# Patient Record
Sex: Female | Born: 1950 | Race: Black or African American | Hispanic: No | Smoking: Current every day smoker
Health system: Southern US, Community
[De-identification: ages and names within clinical notes are randomized; demographics above are authoritative.]

## PROBLEM LIST (undated history)

## (undated) DIAGNOSIS — E7800 Pure hypercholesterolemia, unspecified: Secondary | ICD-10-CM

## (undated) DIAGNOSIS — G629 Polyneuropathy, unspecified: Secondary | ICD-10-CM

## (undated) DIAGNOSIS — M1990 Unspecified osteoarthritis, unspecified site: Secondary | ICD-10-CM

## (undated) DIAGNOSIS — I10 Essential (primary) hypertension: Secondary | ICD-10-CM

## (undated) DIAGNOSIS — I509 Heart failure, unspecified: Secondary | ICD-10-CM

## (undated) DIAGNOSIS — M549 Dorsalgia, unspecified: Secondary | ICD-10-CM

## (undated) HISTORY — PX: KNEE SURGERY: SHX244

## (undated) HISTORY — PX: BACK SURGERY: SHX140

## (undated) HISTORY — PX: TONSILLECTOMY: SUR1361

## (undated) HISTORY — PX: ABDOMINAL HYSTERECTOMY: SHX81

---

## 1998-11-02 ENCOUNTER — Emergency Department (HOSPITAL_COMMUNITY): Admission: EM | Admit: 1998-11-02 | Discharge: 1998-11-02 | Payer: Self-pay | Admitting: Emergency Medicine

## 1999-02-12 ENCOUNTER — Emergency Department (HOSPITAL_COMMUNITY): Admission: EM | Admit: 1999-02-12 | Discharge: 1999-02-13 | Payer: Self-pay | Admitting: Emergency Medicine

## 1999-04-22 ENCOUNTER — Emergency Department (HOSPITAL_COMMUNITY): Admission: EM | Admit: 1999-04-22 | Discharge: 1999-04-22 | Payer: Self-pay | Admitting: Emergency Medicine

## 1999-04-30 ENCOUNTER — Emergency Department (HOSPITAL_COMMUNITY): Admission: EM | Admit: 1999-04-30 | Discharge: 1999-04-30 | Payer: Self-pay | Admitting: Internal Medicine

## 1999-05-03 ENCOUNTER — Emergency Department (HOSPITAL_COMMUNITY): Admission: EM | Admit: 1999-05-03 | Discharge: 1999-05-03 | Payer: Self-pay | Admitting: Emergency Medicine

## 1999-05-05 ENCOUNTER — Emergency Department (HOSPITAL_COMMUNITY): Admission: EM | Admit: 1999-05-05 | Discharge: 1999-05-06 | Payer: Self-pay | Admitting: Emergency Medicine

## 1999-05-10 ENCOUNTER — Emergency Department (HOSPITAL_COMMUNITY): Admission: EM | Admit: 1999-05-10 | Discharge: 1999-05-10 | Payer: Self-pay | Admitting: Emergency Medicine

## 1999-06-09 ENCOUNTER — Ambulatory Visit (HOSPITAL_COMMUNITY): Admission: RE | Admit: 1999-06-09 | Discharge: 1999-06-09 | Payer: Self-pay | Admitting: Cardiology

## 1999-06-16 ENCOUNTER — Emergency Department (HOSPITAL_COMMUNITY): Admission: EM | Admit: 1999-06-16 | Discharge: 1999-06-16 | Payer: Self-pay | Admitting: Emergency Medicine

## 1999-08-02 ENCOUNTER — Emergency Department (HOSPITAL_COMMUNITY): Admission: EM | Admit: 1999-08-02 | Discharge: 1999-08-02 | Payer: Self-pay | Admitting: Emergency Medicine

## 1999-10-07 ENCOUNTER — Inpatient Hospital Stay (HOSPITAL_COMMUNITY): Admission: EM | Admit: 1999-10-07 | Discharge: 1999-10-09 | Payer: Self-pay | Admitting: Cardiology

## 1999-10-27 ENCOUNTER — Emergency Department (HOSPITAL_COMMUNITY): Admission: EM | Admit: 1999-10-27 | Discharge: 1999-10-27 | Payer: Self-pay | Admitting: Emergency Medicine

## 1999-12-13 ENCOUNTER — Inpatient Hospital Stay (HOSPITAL_COMMUNITY): Admission: EM | Admit: 1999-12-13 | Discharge: 1999-12-18 | Payer: Self-pay | Admitting: Emergency Medicine

## 2000-01-06 ENCOUNTER — Emergency Department (HOSPITAL_COMMUNITY): Admission: EM | Admit: 2000-01-06 | Discharge: 2000-01-06 | Payer: Self-pay | Admitting: Emergency Medicine

## 2000-02-16 ENCOUNTER — Emergency Department (HOSPITAL_COMMUNITY): Admission: EM | Admit: 2000-02-16 | Discharge: 2000-02-16 | Payer: Self-pay | Admitting: Emergency Medicine

## 2000-03-18 ENCOUNTER — Emergency Department (HOSPITAL_COMMUNITY): Admission: EM | Admit: 2000-03-18 | Discharge: 2000-03-18 | Payer: Self-pay | Admitting: Emergency Medicine

## 2000-05-10 ENCOUNTER — Inpatient Hospital Stay (HOSPITAL_COMMUNITY): Admission: EM | Admit: 2000-05-10 | Discharge: 2000-05-12 | Payer: Self-pay | Admitting: Emergency Medicine

## 2000-05-29 ENCOUNTER — Emergency Department (HOSPITAL_COMMUNITY): Admission: EM | Admit: 2000-05-29 | Discharge: 2000-05-29 | Payer: Self-pay | Admitting: Emergency Medicine

## 2000-06-27 ENCOUNTER — Emergency Department (HOSPITAL_COMMUNITY): Admission: EM | Admit: 2000-06-27 | Discharge: 2000-06-27 | Payer: Self-pay | Admitting: *Deleted

## 2000-07-07 ENCOUNTER — Inpatient Hospital Stay (HOSPITAL_COMMUNITY): Admission: EM | Admit: 2000-07-07 | Discharge: 2000-07-09 | Payer: Self-pay | Admitting: Emergency Medicine

## 2000-07-14 ENCOUNTER — Inpatient Hospital Stay (HOSPITAL_COMMUNITY): Admission: EM | Admit: 2000-07-14 | Discharge: 2000-07-19 | Payer: Self-pay | Admitting: Emergency Medicine

## 2000-07-25 ENCOUNTER — Emergency Department (HOSPITAL_COMMUNITY): Admission: EM | Admit: 2000-07-25 | Discharge: 2000-07-25 | Payer: Self-pay | Admitting: Emergency Medicine

## 2000-08-18 ENCOUNTER — Ambulatory Visit (HOSPITAL_COMMUNITY): Admission: RE | Admit: 2000-08-18 | Discharge: 2000-08-18 | Payer: Self-pay | Admitting: Family Medicine

## 2000-08-21 ENCOUNTER — Emergency Department (HOSPITAL_COMMUNITY): Admission: EM | Admit: 2000-08-21 | Discharge: 2000-08-21 | Payer: Self-pay | Admitting: Emergency Medicine

## 2000-09-28 ENCOUNTER — Emergency Department (HOSPITAL_COMMUNITY): Admission: EM | Admit: 2000-09-28 | Discharge: 2000-09-28 | Payer: Self-pay | Admitting: Emergency Medicine

## 2000-10-03 ENCOUNTER — Emergency Department (HOSPITAL_COMMUNITY): Admission: EM | Admit: 2000-10-03 | Discharge: 2000-10-03 | Payer: Self-pay | Admitting: Emergency Medicine

## 2000-11-10 ENCOUNTER — Inpatient Hospital Stay (HOSPITAL_COMMUNITY): Admission: EM | Admit: 2000-11-10 | Discharge: 2000-11-11 | Payer: Self-pay | Admitting: Emergency Medicine

## 2000-11-25 ENCOUNTER — Emergency Department (HOSPITAL_COMMUNITY): Admission: EM | Admit: 2000-11-25 | Discharge: 2000-11-25 | Payer: Self-pay | Admitting: Emergency Medicine

## 2000-12-01 ENCOUNTER — Encounter: Admission: RE | Admit: 2000-12-01 | Discharge: 2000-12-01 | Payer: Self-pay | Admitting: Family Medicine

## 2001-03-01 ENCOUNTER — Emergency Department (HOSPITAL_COMMUNITY): Admission: EM | Admit: 2001-03-01 | Discharge: 2001-03-02 | Payer: Self-pay | Admitting: Emergency Medicine

## 2001-04-03 ENCOUNTER — Emergency Department (HOSPITAL_COMMUNITY): Admission: EM | Admit: 2001-04-03 | Discharge: 2001-04-04 | Payer: Self-pay | Admitting: Emergency Medicine

## 2001-04-14 ENCOUNTER — Emergency Department (HOSPITAL_COMMUNITY): Admission: EM | Admit: 2001-04-14 | Discharge: 2001-04-15 | Payer: Self-pay | Admitting: Emergency Medicine

## 2001-04-22 ENCOUNTER — Emergency Department (HOSPITAL_COMMUNITY): Admission: EM | Admit: 2001-04-22 | Discharge: 2001-04-22 | Payer: Self-pay | Admitting: Emergency Medicine

## 2001-04-25 ENCOUNTER — Emergency Department (HOSPITAL_COMMUNITY): Admission: EM | Admit: 2001-04-25 | Discharge: 2001-04-26 | Payer: Self-pay | Admitting: Emergency Medicine

## 2001-04-26 ENCOUNTER — Emergency Department (HOSPITAL_COMMUNITY): Admission: EM | Admit: 2001-04-26 | Discharge: 2001-04-26 | Payer: Self-pay | Admitting: Emergency Medicine

## 2001-04-30 ENCOUNTER — Emergency Department (HOSPITAL_COMMUNITY): Admission: EM | Admit: 2001-04-30 | Discharge: 2001-04-30 | Payer: Self-pay

## 2001-05-10 ENCOUNTER — Emergency Department (HOSPITAL_COMMUNITY): Admission: EM | Admit: 2001-05-10 | Discharge: 2001-05-10 | Payer: Self-pay | Admitting: Emergency Medicine

## 2001-05-13 ENCOUNTER — Encounter: Payer: Self-pay | Admitting: Emergency Medicine

## 2001-05-13 ENCOUNTER — Emergency Department (HOSPITAL_COMMUNITY): Admission: EM | Admit: 2001-05-13 | Discharge: 2001-05-13 | Payer: Self-pay | Admitting: Emergency Medicine

## 2001-05-25 ENCOUNTER — Encounter: Admission: RE | Admit: 2001-05-25 | Discharge: 2001-08-07 | Payer: Self-pay | Admitting: Orthopedic Surgery

## 2001-05-30 ENCOUNTER — Emergency Department (HOSPITAL_COMMUNITY): Admission: EM | Admit: 2001-05-30 | Discharge: 2001-05-30 | Payer: Self-pay | Admitting: Emergency Medicine

## 2001-06-19 ENCOUNTER — Emergency Department (HOSPITAL_COMMUNITY): Admission: EM | Admit: 2001-06-19 | Discharge: 2001-06-19 | Payer: Self-pay | Admitting: Emergency Medicine

## 2001-06-26 ENCOUNTER — Encounter: Payer: Self-pay | Admitting: Family Medicine

## 2001-06-26 ENCOUNTER — Ambulatory Visit (HOSPITAL_COMMUNITY): Admission: RE | Admit: 2001-06-26 | Discharge: 2001-06-26 | Payer: Self-pay | Admitting: Family Medicine

## 2001-07-13 ENCOUNTER — Encounter: Payer: Self-pay | Admitting: Family Medicine

## 2001-07-13 ENCOUNTER — Ambulatory Visit (HOSPITAL_COMMUNITY): Admission: RE | Admit: 2001-07-13 | Discharge: 2001-07-13 | Payer: Self-pay | Admitting: Family Medicine

## 2001-08-05 ENCOUNTER — Encounter: Payer: Self-pay | Admitting: Emergency Medicine

## 2001-08-06 ENCOUNTER — Inpatient Hospital Stay (HOSPITAL_COMMUNITY): Admission: EM | Admit: 2001-08-06 | Discharge: 2001-08-08 | Payer: Self-pay | Admitting: Emergency Medicine

## 2001-08-07 ENCOUNTER — Encounter: Payer: Self-pay | Admitting: Cardiovascular Disease

## 2001-08-22 ENCOUNTER — Ambulatory Visit (HOSPITAL_COMMUNITY): Admission: RE | Admit: 2001-08-22 | Discharge: 2001-08-22 | Payer: Self-pay | Admitting: Family Medicine

## 2001-10-09 ENCOUNTER — Emergency Department (HOSPITAL_COMMUNITY): Admission: EM | Admit: 2001-10-09 | Discharge: 2001-10-09 | Payer: Self-pay

## 2001-11-05 ENCOUNTER — Encounter: Payer: Self-pay | Admitting: Emergency Medicine

## 2001-11-05 ENCOUNTER — Emergency Department (HOSPITAL_COMMUNITY): Admission: EM | Admit: 2001-11-05 | Discharge: 2001-11-05 | Payer: Self-pay | Admitting: Emergency Medicine

## 2001-12-12 ENCOUNTER — Other Ambulatory Visit: Admission: RE | Admit: 2001-12-12 | Discharge: 2001-12-12 | Payer: Self-pay | Admitting: Family Medicine

## 2001-12-25 ENCOUNTER — Emergency Department (HOSPITAL_COMMUNITY): Admission: EM | Admit: 2001-12-25 | Discharge: 2001-12-25 | Payer: Self-pay

## 2001-12-31 ENCOUNTER — Encounter: Payer: Self-pay | Admitting: Emergency Medicine

## 2001-12-31 ENCOUNTER — Emergency Department (HOSPITAL_COMMUNITY): Admission: EM | Admit: 2001-12-31 | Discharge: 2001-12-31 | Payer: Self-pay | Admitting: Emergency Medicine

## 2002-03-12 ENCOUNTER — Emergency Department (HOSPITAL_COMMUNITY): Admission: EM | Admit: 2002-03-12 | Discharge: 2002-03-12 | Payer: Self-pay

## 2002-04-09 ENCOUNTER — Encounter: Payer: Self-pay | Admitting: Emergency Medicine

## 2002-04-09 ENCOUNTER — Emergency Department (HOSPITAL_COMMUNITY): Admission: EM | Admit: 2002-04-09 | Discharge: 2002-04-10 | Payer: Self-pay | Admitting: Emergency Medicine

## 2002-05-14 ENCOUNTER — Encounter: Payer: Self-pay | Admitting: Cardiovascular Disease

## 2002-05-14 ENCOUNTER — Ambulatory Visit (HOSPITAL_COMMUNITY): Admission: RE | Admit: 2002-05-14 | Discharge: 2002-05-14 | Payer: Self-pay | Admitting: Cardiovascular Disease

## 2002-05-21 ENCOUNTER — Emergency Department (HOSPITAL_COMMUNITY): Admission: EM | Admit: 2002-05-21 | Discharge: 2002-05-21 | Payer: Self-pay | Admitting: Emergency Medicine

## 2002-06-05 ENCOUNTER — Ambulatory Visit (HOSPITAL_COMMUNITY): Admission: RE | Admit: 2002-06-05 | Discharge: 2002-06-05 | Payer: Self-pay | Admitting: Family Medicine

## 2002-06-05 ENCOUNTER — Encounter: Payer: Self-pay | Admitting: Family Medicine

## 2002-06-09 ENCOUNTER — Emergency Department (HOSPITAL_COMMUNITY): Admission: EM | Admit: 2002-06-09 | Discharge: 2002-06-10 | Payer: Self-pay | Admitting: Emergency Medicine

## 2002-07-04 ENCOUNTER — Encounter: Admission: RE | Admit: 2002-07-04 | Discharge: 2002-07-04 | Payer: Self-pay | Admitting: Orthopedic Surgery

## 2002-07-04 ENCOUNTER — Encounter: Payer: Self-pay | Admitting: Orthopedic Surgery

## 2002-07-10 ENCOUNTER — Emergency Department (HOSPITAL_COMMUNITY): Admission: EM | Admit: 2002-07-10 | Discharge: 2002-07-10 | Payer: Self-pay | Admitting: *Deleted

## 2002-07-17 ENCOUNTER — Inpatient Hospital Stay (HOSPITAL_COMMUNITY): Admission: EM | Admit: 2002-07-17 | Discharge: 2002-07-18 | Payer: Self-pay | Admitting: Emergency Medicine

## 2002-07-18 ENCOUNTER — Encounter: Payer: Self-pay | Admitting: Cardiovascular Disease

## 2002-07-25 ENCOUNTER — Encounter: Admission: RE | Admit: 2002-07-25 | Discharge: 2002-07-25 | Payer: Self-pay | Admitting: Cardiovascular Disease

## 2002-08-03 ENCOUNTER — Encounter: Payer: Self-pay | Admitting: Neurosurgery

## 2002-08-03 ENCOUNTER — Inpatient Hospital Stay (HOSPITAL_COMMUNITY): Admission: RE | Admit: 2002-08-03 | Discharge: 2002-08-05 | Payer: Self-pay | Admitting: Neurosurgery

## 2002-08-16 ENCOUNTER — Emergency Department (HOSPITAL_COMMUNITY): Admission: EM | Admit: 2002-08-16 | Discharge: 2002-08-16 | Payer: Self-pay

## 2002-08-26 ENCOUNTER — Emergency Department (HOSPITAL_COMMUNITY): Admission: EM | Admit: 2002-08-26 | Discharge: 2002-08-26 | Payer: Self-pay | Admitting: Emergency Medicine

## 2002-09-13 ENCOUNTER — Encounter: Admission: RE | Admit: 2002-09-13 | Discharge: 2002-09-13 | Payer: Self-pay | Admitting: Neurosurgery

## 2002-09-13 ENCOUNTER — Encounter: Payer: Self-pay | Admitting: Neurosurgery

## 2002-09-26 ENCOUNTER — Encounter: Payer: Self-pay | Admitting: Neurosurgery

## 2002-09-26 ENCOUNTER — Encounter: Admission: RE | Admit: 2002-09-26 | Discharge: 2002-09-26 | Payer: Self-pay | Admitting: Neurosurgery

## 2002-10-10 ENCOUNTER — Encounter: Admission: RE | Admit: 2002-10-10 | Discharge: 2002-10-10 | Payer: Self-pay | Admitting: Neurosurgery

## 2002-10-10 ENCOUNTER — Encounter: Payer: Self-pay | Admitting: Neurosurgery

## 2002-10-21 ENCOUNTER — Emergency Department (HOSPITAL_COMMUNITY): Admission: EM | Admit: 2002-10-21 | Discharge: 2002-10-21 | Payer: Self-pay | Admitting: Emergency Medicine

## 2002-10-22 ENCOUNTER — Ambulatory Visit (HOSPITAL_COMMUNITY): Admission: RE | Admit: 2002-10-22 | Discharge: 2002-10-22 | Payer: Self-pay | Admitting: Neurosurgery

## 2002-10-22 ENCOUNTER — Encounter: Payer: Self-pay | Admitting: Neurosurgery

## 2002-10-24 ENCOUNTER — Ambulatory Visit (HOSPITAL_COMMUNITY): Admission: RE | Admit: 2002-10-24 | Discharge: 2002-10-24 | Payer: Self-pay | Admitting: Neurosurgery

## 2002-11-04 ENCOUNTER — Emergency Department (HOSPITAL_COMMUNITY): Admission: EM | Admit: 2002-11-04 | Discharge: 2002-11-04 | Payer: Self-pay | Admitting: *Deleted

## 2002-11-07 ENCOUNTER — Encounter: Payer: Self-pay | Admitting: Neurosurgery

## 2002-11-09 ENCOUNTER — Inpatient Hospital Stay (HOSPITAL_COMMUNITY): Admission: RE | Admit: 2002-11-09 | Discharge: 2002-11-13 | Payer: Self-pay | Admitting: Neurosurgery

## 2002-11-09 ENCOUNTER — Encounter: Payer: Self-pay | Admitting: Neurosurgery

## 2002-11-23 ENCOUNTER — Emergency Department (HOSPITAL_COMMUNITY): Admission: EM | Admit: 2002-11-23 | Discharge: 2002-11-23 | Payer: Self-pay | Admitting: Emergency Medicine

## 2002-11-23 ENCOUNTER — Encounter: Payer: Self-pay | Admitting: Emergency Medicine

## 2003-01-10 ENCOUNTER — Encounter: Payer: Self-pay | Admitting: Emergency Medicine

## 2003-01-10 ENCOUNTER — Emergency Department (HOSPITAL_COMMUNITY): Admission: EM | Admit: 2003-01-10 | Discharge: 2003-01-10 | Payer: Self-pay | Admitting: Emergency Medicine

## 2003-02-01 ENCOUNTER — Emergency Department (HOSPITAL_COMMUNITY): Admission: EM | Admit: 2003-02-01 | Discharge: 2003-02-01 | Payer: Self-pay | Admitting: Emergency Medicine

## 2003-02-06 ENCOUNTER — Encounter: Admission: RE | Admit: 2003-02-06 | Discharge: 2003-02-06 | Payer: Self-pay | Admitting: Neurosurgery

## 2003-02-06 ENCOUNTER — Encounter: Payer: Self-pay | Admitting: Neurosurgery

## 2003-02-09 ENCOUNTER — Emergency Department (HOSPITAL_COMMUNITY): Admission: EM | Admit: 2003-02-09 | Discharge: 2003-02-09 | Payer: Self-pay | Admitting: Emergency Medicine

## 2003-02-20 ENCOUNTER — Inpatient Hospital Stay (HOSPITAL_COMMUNITY): Admission: RE | Admit: 2003-02-20 | Discharge: 2003-02-25 | Payer: Self-pay | Admitting: Neurosurgery

## 2003-02-20 ENCOUNTER — Encounter: Payer: Self-pay | Admitting: Neurosurgery

## 2003-03-03 ENCOUNTER — Emergency Department (HOSPITAL_COMMUNITY): Admission: EM | Admit: 2003-03-03 | Discharge: 2003-03-03 | Payer: Self-pay | Admitting: Emergency Medicine

## 2003-03-03 ENCOUNTER — Encounter: Payer: Self-pay | Admitting: Emergency Medicine

## 2003-04-05 ENCOUNTER — Encounter: Payer: Self-pay | Admitting: *Deleted

## 2003-04-05 ENCOUNTER — Emergency Department (HOSPITAL_COMMUNITY): Admission: EM | Admit: 2003-04-05 | Discharge: 2003-04-05 | Payer: Self-pay | Admitting: *Deleted

## 2003-04-25 ENCOUNTER — Emergency Department (HOSPITAL_COMMUNITY): Admission: EM | Admit: 2003-04-25 | Discharge: 2003-04-25 | Payer: Self-pay | Admitting: Emergency Medicine

## 2003-05-28 ENCOUNTER — Encounter: Payer: Self-pay | Admitting: Emergency Medicine

## 2003-05-28 ENCOUNTER — Emergency Department (HOSPITAL_COMMUNITY): Admission: EM | Admit: 2003-05-28 | Discharge: 2003-05-28 | Payer: Self-pay | Admitting: Emergency Medicine

## 2003-06-12 ENCOUNTER — Encounter: Payer: Self-pay | Admitting: Family Medicine

## 2003-06-12 ENCOUNTER — Ambulatory Visit (HOSPITAL_COMMUNITY): Admission: RE | Admit: 2003-06-12 | Discharge: 2003-06-12 | Payer: Self-pay | Admitting: Family Medicine

## 2003-08-27 ENCOUNTER — Emergency Department (HOSPITAL_COMMUNITY): Admission: EM | Admit: 2003-08-27 | Discharge: 2003-08-28 | Payer: Self-pay | Admitting: Emergency Medicine

## 2003-10-09 ENCOUNTER — Emergency Department (HOSPITAL_COMMUNITY): Admission: EM | Admit: 2003-10-09 | Discharge: 2003-10-10 | Payer: Self-pay | Admitting: Emergency Medicine

## 2004-01-28 ENCOUNTER — Emergency Department (HOSPITAL_COMMUNITY): Admission: EM | Admit: 2004-01-28 | Discharge: 2004-01-28 | Payer: Self-pay | Admitting: Family Medicine

## 2004-03-15 ENCOUNTER — Emergency Department (HOSPITAL_COMMUNITY): Admission: EM | Admit: 2004-03-15 | Discharge: 2004-03-15 | Payer: Self-pay | Admitting: *Deleted

## 2004-04-18 ENCOUNTER — Emergency Department (HOSPITAL_COMMUNITY): Admission: EM | Admit: 2004-04-18 | Discharge: 2004-04-18 | Payer: Self-pay | Admitting: Family Medicine

## 2004-04-18 ENCOUNTER — Emergency Department (HOSPITAL_COMMUNITY): Admission: EM | Admit: 2004-04-18 | Discharge: 2004-04-18 | Payer: Self-pay | Admitting: Emergency Medicine

## 2004-05-09 ENCOUNTER — Emergency Department (HOSPITAL_COMMUNITY): Admission: EM | Admit: 2004-05-09 | Discharge: 2004-05-09 | Payer: Self-pay | Admitting: Family Medicine

## 2004-05-19 ENCOUNTER — Emergency Department (HOSPITAL_COMMUNITY): Admission: EM | Admit: 2004-05-19 | Discharge: 2004-05-19 | Payer: Self-pay | Admitting: Emergency Medicine

## 2004-06-22 ENCOUNTER — Ambulatory Visit: Payer: Self-pay | Admitting: Family Medicine

## 2004-06-25 ENCOUNTER — Ambulatory Visit (HOSPITAL_COMMUNITY): Admission: RE | Admit: 2004-06-25 | Discharge: 2004-06-25 | Payer: Self-pay | Admitting: Family Medicine

## 2004-07-06 ENCOUNTER — Emergency Department (HOSPITAL_COMMUNITY): Admission: EM | Admit: 2004-07-06 | Discharge: 2004-07-06 | Payer: Self-pay | Admitting: Family Medicine

## 2004-07-23 ENCOUNTER — Emergency Department (HOSPITAL_COMMUNITY): Admission: EM | Admit: 2004-07-23 | Discharge: 2004-07-23 | Payer: Self-pay | Admitting: Emergency Medicine

## 2004-08-23 ENCOUNTER — Emergency Department (HOSPITAL_COMMUNITY): Admission: EM | Admit: 2004-08-23 | Discharge: 2004-08-23 | Payer: Self-pay | Admitting: Emergency Medicine

## 2004-08-26 ENCOUNTER — Ambulatory Visit (HOSPITAL_COMMUNITY): Admission: RE | Admit: 2004-08-26 | Discharge: 2004-08-26 | Payer: Self-pay | Admitting: Cardiovascular Disease

## 2004-09-22 ENCOUNTER — Ambulatory Visit: Payer: Self-pay | Admitting: Family Medicine

## 2004-10-12 ENCOUNTER — Emergency Department (HOSPITAL_COMMUNITY): Admission: AD | Admit: 2004-10-12 | Discharge: 2004-10-12 | Payer: Self-pay | Admitting: Family Medicine

## 2005-01-06 ENCOUNTER — Emergency Department (HOSPITAL_COMMUNITY): Admission: EM | Admit: 2005-01-06 | Discharge: 2005-01-06 | Payer: Self-pay | Admitting: Family Medicine

## 2005-01-23 ENCOUNTER — Emergency Department (HOSPITAL_COMMUNITY): Admission: EM | Admit: 2005-01-23 | Discharge: 2005-01-23 | Payer: Self-pay | Admitting: Family Medicine

## 2005-01-29 ENCOUNTER — Ambulatory Visit: Payer: Self-pay | Admitting: Internal Medicine

## 2005-02-18 ENCOUNTER — Ambulatory Visit: Payer: Self-pay | Admitting: Family Medicine

## 2005-02-19 ENCOUNTER — Emergency Department (HOSPITAL_COMMUNITY): Admission: EM | Admit: 2005-02-19 | Discharge: 2005-02-19 | Payer: Self-pay | Admitting: Family Medicine

## 2005-02-22 ENCOUNTER — Ambulatory Visit: Payer: Self-pay | Admitting: Family Medicine

## 2005-02-23 ENCOUNTER — Ambulatory Visit (HOSPITAL_COMMUNITY): Admission: RE | Admit: 2005-02-23 | Discharge: 2005-02-23 | Payer: Self-pay | Admitting: Family Medicine

## 2005-03-01 ENCOUNTER — Ambulatory Visit: Payer: Self-pay | Admitting: Family Medicine

## 2005-03-19 ENCOUNTER — Emergency Department (HOSPITAL_COMMUNITY): Admission: EM | Admit: 2005-03-19 | Discharge: 2005-03-19 | Payer: Self-pay | Admitting: Emergency Medicine

## 2005-03-28 ENCOUNTER — Emergency Department (HOSPITAL_COMMUNITY): Admission: EM | Admit: 2005-03-28 | Discharge: 2005-03-28 | Payer: Self-pay | Admitting: Emergency Medicine

## 2005-04-06 ENCOUNTER — Ambulatory Visit: Payer: Self-pay | Admitting: Family Medicine

## 2005-04-24 ENCOUNTER — Emergency Department (HOSPITAL_COMMUNITY): Admission: EM | Admit: 2005-04-24 | Discharge: 2005-04-24 | Payer: Self-pay | Admitting: Family Medicine

## 2005-05-04 ENCOUNTER — Ambulatory Visit (HOSPITAL_COMMUNITY): Admission: RE | Admit: 2005-05-04 | Discharge: 2005-05-04 | Payer: Self-pay | Admitting: Neurosurgery

## 2005-05-25 ENCOUNTER — Encounter: Admission: RE | Admit: 2005-05-25 | Discharge: 2005-05-25 | Payer: Self-pay | Admitting: Neurosurgery

## 2005-06-09 ENCOUNTER — Encounter: Admission: RE | Admit: 2005-06-09 | Discharge: 2005-06-09 | Payer: Self-pay | Admitting: Neurosurgery

## 2005-06-21 ENCOUNTER — Ambulatory Visit: Payer: Self-pay | Admitting: Family Medicine

## 2005-07-14 ENCOUNTER — Ambulatory Visit: Payer: Self-pay | Admitting: Family Medicine

## 2005-09-20 ENCOUNTER — Ambulatory Visit: Payer: Self-pay | Admitting: Family Medicine

## 2005-10-06 ENCOUNTER — Ambulatory Visit: Payer: Self-pay | Admitting: Family Medicine

## 2005-11-16 ENCOUNTER — Ambulatory Visit: Payer: Self-pay | Admitting: Family Medicine

## 2006-02-02 ENCOUNTER — Emergency Department (HOSPITAL_COMMUNITY): Admission: EM | Admit: 2006-02-02 | Discharge: 2006-02-02 | Payer: Self-pay | Admitting: Family Medicine

## 2006-02-04 ENCOUNTER — Emergency Department (HOSPITAL_COMMUNITY): Admission: EM | Admit: 2006-02-04 | Discharge: 2006-02-04 | Payer: Self-pay | Admitting: Family Medicine

## 2006-02-06 ENCOUNTER — Emergency Department (HOSPITAL_COMMUNITY): Admission: EM | Admit: 2006-02-06 | Discharge: 2006-02-06 | Payer: Self-pay | Admitting: Emergency Medicine

## 2006-02-09 ENCOUNTER — Encounter: Payer: Self-pay | Admitting: Cardiovascular Disease

## 2006-02-09 ENCOUNTER — Encounter: Payer: Self-pay | Admitting: Cardiology

## 2006-02-09 ENCOUNTER — Encounter: Payer: Self-pay | Admitting: Family Medicine

## 2006-02-09 ENCOUNTER — Encounter: Payer: Self-pay | Admitting: Emergency Medicine

## 2006-02-25 ENCOUNTER — Emergency Department (HOSPITAL_COMMUNITY): Admission: EM | Admit: 2006-02-25 | Discharge: 2006-02-25 | Payer: Self-pay | Admitting: Emergency Medicine

## 2006-04-15 ENCOUNTER — Ambulatory Visit: Payer: Self-pay | Admitting: Family Medicine

## 2006-05-04 ENCOUNTER — Ambulatory Visit: Payer: Self-pay | Admitting: Family Medicine

## 2006-07-08 ENCOUNTER — Ambulatory Visit: Payer: Self-pay | Admitting: Family Medicine

## 2007-02-02 ENCOUNTER — Ambulatory Visit (HOSPITAL_COMMUNITY): Admission: RE | Admit: 2007-02-02 | Discharge: 2007-02-02 | Payer: Self-pay | Admitting: *Deleted

## 2007-05-02 DIAGNOSIS — M479 Spondylosis, unspecified: Secondary | ICD-10-CM | POA: Insufficient documentation

## 2007-05-02 DIAGNOSIS — F1910 Other psychoactive substance abuse, uncomplicated: Secondary | ICD-10-CM | POA: Insufficient documentation

## 2007-05-02 DIAGNOSIS — J309 Allergic rhinitis, unspecified: Secondary | ICD-10-CM | POA: Insufficient documentation

## 2007-05-02 DIAGNOSIS — E119 Type 2 diabetes mellitus without complications: Secondary | ICD-10-CM | POA: Insufficient documentation

## 2007-05-02 DIAGNOSIS — F329 Major depressive disorder, single episode, unspecified: Secondary | ICD-10-CM

## 2007-05-02 DIAGNOSIS — Z8679 Personal history of other diseases of the circulatory system: Secondary | ICD-10-CM | POA: Insufficient documentation

## 2007-05-15 DIAGNOSIS — I10 Essential (primary) hypertension: Secondary | ICD-10-CM | POA: Insufficient documentation

## 2007-12-18 ENCOUNTER — Encounter: Admission: RE | Admit: 2007-12-18 | Discharge: 2007-12-18 | Payer: Self-pay | Admitting: Family Medicine

## 2008-02-23 ENCOUNTER — Emergency Department (HOSPITAL_COMMUNITY): Admission: EM | Admit: 2008-02-23 | Discharge: 2008-02-23 | Payer: Self-pay | Admitting: Family Medicine

## 2008-08-04 ENCOUNTER — Observation Stay (HOSPITAL_COMMUNITY): Admission: EM | Admit: 2008-08-04 | Discharge: 2008-08-06 | Payer: Self-pay | Admitting: Emergency Medicine

## 2008-08-05 ENCOUNTER — Ambulatory Visit: Payer: Self-pay | Admitting: Vascular Surgery

## 2008-08-05 ENCOUNTER — Encounter (INDEPENDENT_AMBULATORY_CARE_PROVIDER_SITE_OTHER): Payer: Self-pay | Admitting: Internal Medicine

## 2009-02-23 ENCOUNTER — Emergency Department (HOSPITAL_COMMUNITY): Admission: EM | Admit: 2009-02-23 | Discharge: 2009-02-23 | Payer: Self-pay | Admitting: Emergency Medicine

## 2009-03-03 ENCOUNTER — Encounter: Admission: RE | Admit: 2009-03-03 | Discharge: 2009-03-03 | Payer: Self-pay | Admitting: Family Medicine

## 2009-03-17 ENCOUNTER — Ambulatory Visit (HOSPITAL_COMMUNITY): Admission: RE | Admit: 2009-03-17 | Discharge: 2009-03-17 | Payer: Self-pay | Admitting: Cardiovascular Disease

## 2009-12-19 ENCOUNTER — Emergency Department (HOSPITAL_COMMUNITY): Admission: EM | Admit: 2009-12-19 | Discharge: 2009-12-19 | Payer: Self-pay | Admitting: Family Medicine

## 2010-12-31 ENCOUNTER — Inpatient Hospital Stay (INDEPENDENT_AMBULATORY_CARE_PROVIDER_SITE_OTHER)
Admission: RE | Admit: 2010-12-31 | Discharge: 2010-12-31 | Disposition: A | Discharge status: Home / Self Care | Payer: Medicare Other | Source: Ambulatory Visit | Attending: Family Medicine | Admitting: Family Medicine

## 2010-12-31 DIAGNOSIS — J069 Acute upper respiratory infection, unspecified: Secondary | ICD-10-CM

## 2010-12-31 DIAGNOSIS — M549 Dorsalgia, unspecified: Secondary | ICD-10-CM

## 2011-02-23 NOTE — H&P (Signed)
[name], [name]             ACCOUNT NO.:  [id number]   MEDICAL RECORD NO.:  [id number]          PATIENT TYPE:  INP   LOCATION:  2039                         FACILITY:  [facility]   PHYSICIAN:  Kelsey Carey, M.D. DATE OF BIRTH:  December 12, 1950   DATE OF ADMISSION:  08/04/2008  DATE OF DISCHARGE:                              HISTORY & PHYSICAL   PRIMARY CARE PHYSICIAN:  Kelsey Carey, M.D.   CHIEF COMPLAINT:  Chest pain.   HISTORY OF PRESENT ILLNESS:  This is a 60 year old female who was  brought to the hospital secondary to complaints of chest pain, which  started at about 1:30 p.m. while she was on her job.  She describes  having substernal area chest pain,  which lasted for a few minutes and  was associated with nausea, no vomiting, but diaphoresis.  She states  the pain recurred.  She was advised on her job to report to the  emergency department for an evaluation.Marland Kitchen   PAST MEDICAL HISTORY:  1. Coronary artery disease.  2. Hypertension.  3. Congestive heart failure syndrome.  4. Degenerative disk disease, status post lumbar fusion L4-L5  5. Previous cardiac catheterization in March 2001 (cardiologist is      Kelsey Carey, M.D.)  The patient reports that her previous      cardiac catheterization found nonsurgical or non procedural mild      coronary artery disease.   MEDICATIONS:  The patient's family will call back to give her  medications and her dosages.   ALLERGIES:  PENICILLIN, SULFA, CODEINE, TYLENOL.   SOCIAL HISTORY:  The patient is a smoker and she reports drinking one  beer on occasion.  No history of illicit drug usage.   FAMILY HISTORY:  Positive for coronary artery disease in her father.  Hypertension in her mother.  Mother had congestive heart failure, so did  one brother.  Type 2 diabetes mellitus in a brother, and cancer in 2  maternal uncles.  Pertinent positives are mentioned above.   PHYSICAL EXAMINATION FINDINGS:  This is a 60 year old obese female  in no  discomfort or visible distress.  VITAL SIGNS:  Temperature 97.7, blood pressure 130/78, heart rate 99,  respirations 20, O2 saturations 98-99%.  HEENT: Normocephalic, atraumatic.  Pupils equally round and reactive to  light.  Extraocular movements are intact.  Funduscopic benign.  There is  no scleral icterus.  Oropharynx was clear.  NECK:  Supple, full range of motion.  No thyromegaly, adenopathy or  jugular venous distention.  CARDIOVASCULAR:  Regular rate and rhythm.  No murmurs, gallops or rubs.  LUNGS:  Clear to auscultation bilaterally.  ABDOMEN:  Positive bowel sounds; soft, nontender, nondistended.  EXTREMITIES: Without cyanosis, clubbing or edema.  NEUROLOGIC EXAMINATION:  Nonfocal.   LABORATORY STUDIES:  White blood cell count 8.7, hemoglobin 13.1,  hematocrit 38.7, platelets 222, neutrophils 64%, lymphocytes 30%.  Sodium 138, potassium 3.7, chloride 105, carbon dioxide 25, BUN 12,  creatinine 0.72, glucose 115.  Albumin 3.8, calcium 9.4.  Cardiac  enzymes:  CK total 235, CK-MB 3.2, relative index 1.4, troponin less  than 0.01.  Urinalysis  negative, except for trace hemoglobin.  Chest x-  ray reveals no acute disease process; however, mild chronic  peribronchial thickening is present.   ASSESSMENT:  A 60 year old female being admitted with:  1. Chest pain.  2. Type 2 diabetes mellitus.  3. Hypertension.  4. Coronary artery disease history.   PLAN:  The patient will be admitted to a telemetry area for cardiac  monitoring.  Cardiac enzymes will continue to be performed.  The  patient's regular medications will be verified.  She will be placed on  DVT and GI prophylaxis at this time.  Her EKG reveals no acute findings  at this time.      Kelsey Carey, M.D.  Electronically Signed     HJ/MEDQ  D:  08/05/2008  T:  08/06/2008  Job:  [id number]   cc:   Kelsey Carey, M.D.

## 2011-02-23 NOTE — Discharge Summary (Signed)
[name], Kelsey Carey             ACCOUNT NO.:  [id number]   MEDICAL RECORD NO.:  [id number]          PATIENT TYPE:  OBV   LOCATION:  2039                         FACILITY:  [facility]   PHYSICIAN:  Theodosia Paling, MD    DATE OF BIRTH:  12-01-1950   DATE OF ADMISSION:  08/04/2008  DATE OF DISCHARGE:  08/06/2008                               DISCHARGE SUMMARY   PRIMARY CARE PHYSICIAN:  Renaye Rakers, MD   ADMITTING HISTORY:  Please refer the admission note dictated by Dr.  Lovell Sheehan on August 04, 2008.   DISCHARGE DIAGNOSES:  1. Chest pain.  2. Generalized body ache.  3. Questionable history of coronary artery disease; however, the last      cardiac catheterization was in 2001, which according to the      patient, it was mild coronary artery disease.  4. Chronic back pain history.   DISCHARGE MEDICATION:  Aspirin enteric-coated 81 mg p.o. daily.   HOSPITAL COURSE:  Following issues were addressed during the  hospitalization of the patient.  1. Chest pain.  The patient underwent telemetry evaluation, cardiac      enzymes,  monitor.  Her chest pain resolved.  Three sets of cardiac      enzymes were normal.  She did not have recurrence of any chest      pain, also her telemetry and EKG were normal.  The patient      underwent an echocardiogram, which did not show any wall motion      abnormality.  She will follow up with the primary care physician      for establishing the stress test given her family history of      coronary artery disease in her father and mother and brother.  2. Generalized body ache most likely secondary to viral illness.  The      patient is recommended to continue to take p.r.n. Tylenol.  3. Left upper extremity pain.  The patient was having left upper      extremity pain.  She underwent venus and arterial duplex, which was      essentially negative.  4. Tobacco abuse.  The patient underwent counseling for that.  She      received nicotine patch during her  hospitalization here.  5. The patient has questionable history of type 2 diabetes, however,      her fingerstick glucoses were normal and her hemoglobin A1c was 6.0      and her fasting glucose was less than 120.  At this time, I am not      discharging her on any diabetic medication.  6. Also, the patient has a questionable history of hypertension;      however, the patient's blood pressure was in the range of 90s to      100s while she was admitted in the hospital.   PROCEDURE PERFORMED:  None.   CONSULTATION OBTAINED:  None.   DISPOSITION:  The patient will follow up with Dr. Adrian Saran for outpatient  stress testing.   Total time spent in discharge of this patient 40 minutes.  Theodosia Paling, MD  Electronically Signed     NP/MEDQ  D:  08/06/2008  T:  08/06/2008  Job:  [id number]   cc:   Renaye Rakers, M.D.

## 2011-02-26 NOTE — Discharge Summary (Signed)
   NAME:  Kelsey Carey, Kelsey Carey                       ACCOUNT NO.:  [id number]   MEDICAL RECORD NO.:  [id number]                   PATIENT TYPE:  INP   LOCATION:  3021                                 FACILITY:  [facility]   PHYSICIAN:  Coletta Memos, M.D.                  DATE OF BIRTH:  Aug 03, 1951   DATE OF ADMISSION:  02/20/2003  DATE OF DISCHARGE:  02/25/2003                                 DISCHARGE SUMMARY   ADMITTING DIAGNOSES:  1. Recurrent disk herniation, L4-5.  2. Degenerative disk disease, L4-5.  3. Lumbar radiculopathy.   DISCHARGE DIAGNOSES:  1. Recurrent disk herniation, L4-5.  2. Degenerative disk disease, L4-5.  3. Lumbar radiculopathy.   PROCEDURE:  Posterior lumbar interbody fusion procedure, L4-5, with  __________ Synthes cage, 9 mm, transverse lumbar interbody fusion and a  posterolateral arthrodesis.   COMPLICATIONS:  None.   DISCHARGE STATUS:  Alive and well.   MEDICATIONS:  Percocet 5 mg one tablet q.6h.   INDICATIONS:  The patient is a patient of mine who underwent two disk  operations previously.  After a second recurrence, I recommended and she  agreed to undergo lumbar fusion.  She was admitted and taken to the  operating room and had an uncomplicated procedure on Feb 20, 2003.  Postop,  she was slow to mobilize but eventually got up and was moving about.  At  discharge, she is walking with a walker and able to take care of the  activities of daily living.  Her wound is clean, dry and without signs of  infection.  She will be discharged home.   DISCHARGE INSTRUCTIONS:  She is given instructions to give [place] a call if her  temperature goes over 101.5, if there is drainage from the wound or any  change in her current exam.   COMMENT:  She has had some minor pain in her left leg, not the right side,  which was originally hurting, unusual, only because she had a TLIF done from  the right side, no dissection was done in or around the nerve root on the  left and  the only things I replaced were screws on the left side and the  screw that is actually there is in the lateral aspect of the pedicle, so  clearly, there was no breech into the canal.                                               Coletta Memos, M.D.    KC/MEDQ  D:  02/25/2003  T:  02/26/2003  Job:  [id number]

## 2011-02-26 NOTE — Op Note (Signed)
NAME:  Kelsey Carey, Kelsey Carey                       ACCOUNT NO.:  [id number]   MEDICAL RECORD NO.:  [id number]                   PATIENT TYPE:  INP   LOCATION:  3021                                 FACILITY:  [facility]   PHYSICIAN:  Coletta Memos, M.D.                  DATE OF BIRTH:  05-30-1951   DATE OF PROCEDURE:  02/20/2003  DATE OF DISCHARGE:                                 OPERATIVE REPORT   PREOPERATIVE DIAGNOSIS:  1. Recurrent disk herniation L4-5.  2. Degenerative disk disease L4-5.   POSTOPERATIVE DIAGNOSIS:  1. Recurrent disk herniation L4-5.  2. Degenerative disk disease L4-5.   OPERATION PERFORMED:  1. Posterior lumbar interbody fusion, L4-5.  2. Posterolateral arthrodesis, L4-5.  3. Pedicle screw fixation, L4 to L5.  4. Interbody TLIF Peek Synthes cage 9 mm.  5. Morselized autograft for posterolateral and posterolumbar arthrodeses.  6. Morselized allograft for interbody arthrodesis and posterolateral     arthrodesis.  7. Frameless stereotactic guidance for screw placement.   SURGEON:  Coletta Memos, M.D.   ASSISTANT:  Cristi Loron, M.D.   COMPLICATIONS:  None.   ANESTHESIA:  General.   INDICATIONS FOR PROCEDURE:  The patient is a 60 year old who has had two  recurrent disk  herniations.  I therefore recommended and she agreed to  undergo lumbar fusion.   DESCRIPTION OF PROCEDURE:  The patient was brought to the operating room,  intubated and placed under general anesthesia.  She was then rolled prone  onto body rolls and all pressure points were properly padded.  A Foley  catheter had been placed without complications prior to that.  Her back was  prepped and she was draped in sterile fashion.  20mL 0.5% lidocaine  1:200,000 strength epinephrine was injected into the old incision and both  rostral and caudal to it.  I then exposed the lamina of L3, L4, L5 and the  transverse processes of L4 and L5 in a bilateral fashion.  Then using a  [employer] stereotactic  guidance system,  attached a passive star on the L3  spinous process and then registered the L4 vertebra.  I then placed two  pedicle screws, an L4 40 mm x 6.5 mm using stereotactic guidance.  I took an  x-ray and it showed that those screws were in good position.  I then  attempted to use the same system at L5; however, I was not able to obtain a  registration which was satisfactory and therefore I removed the system.  Then using fluoroscopic guidance, I placed pedicle screws in L5 both 6.5 x  40 mm in length.  Each screw was drilled, tapped and holes were probed with  pedicle probes.  When that was done I then performed a laminectomy on the  right side where the surgery had been done previously.  I was able to find a  disk space and remove some ligamentum flavum.  Then did a very thorough  diskectomy at L4-5 just on the right side.  The left side never having been  touched, I did not think needed to be exposed to retraction.  I was able to  remove a signficant amount of disk.  I then prepared the disk space using  specialized Synthes TLIF instrumentation.  I then placed a 9mm T-cage packed  with autograft into the interspace.  I then placed both morselized autograft  and allograft into the disk space.  Then out over the pedicle screws which  had been decorticated using a high speed drill on both the right and left  sides and placed more bone.  I then irrigated the wound.  Dr. Lovell Sheehan helped  with the posterolateral arthrodesis.  He also helped with the posterior  lumbar interbody fusion.  Then we released the retractors.  Then closed the  wound in layered fashion using Vicryl sutures.  Sterile dressing was applied  and that was Dermabond.  The patient tolerated the procedure well. Vicryl  sutures were used in all layers in the thoracolumbar fascia, subcutaneous  tissue and skin edges.                                                Coletta Memos, M.D.    KC/MEDQ  D:  02/20/2003  T:   02/21/2003  Job:  [id number]

## 2011-07-12 LAB — URINE MICROSCOPIC-ADD ON

## 2011-07-12 LAB — CBC
Platelets: 222
RDW: 13.1
WBC: 8.7

## 2011-07-12 LAB — GLUCOSE, CAPILLARY
Glucose-Capillary: 119 — ABNORMAL HIGH
Glucose-Capillary: 148 — ABNORMAL HIGH

## 2011-07-12 LAB — URINALYSIS, ROUTINE W REFLEX MICROSCOPIC
Bilirubin Urine: NEGATIVE
Nitrite: NEGATIVE
Specific Gravity, Urine: 1.017
Urobilinogen, UA: 0.2
pH: 6

## 2011-07-12 LAB — TROPONIN I

## 2011-07-12 LAB — CK TOTAL AND CKMB (NOT AT ARMC)
CK, MB: 3.2
Relative Index: 1.4
Relative Index: 1.4
Total CK: 145
Total CK: 235 — ABNORMAL HIGH

## 2011-07-12 LAB — COMPREHENSIVE METABOLIC PANEL
ALT: 19
AST: 25
Calcium: 9.4
Creatinine, Ser: 0.72
GFR calc non Af Amer: >60
Sodium: 138
Total Protein: 6.1

## 2011-07-12 LAB — DIFFERENTIAL
Basophils Absolute: 0.1
Eosinophils Relative: 1
Lymphocytes Relative: 30
Lymphs Abs: 2.7
Monocytes Absolute: 0.4
Neutro Abs: 5.5

## 2011-07-12 LAB — HEMOGLOBIN A1C

## 2011-07-12 LAB — B-NATRIURETIC PEPTIDE (CONVERTED LAB): Pro B Natriuretic peptide (BNP): <30

## 2011-12-23 ENCOUNTER — Other Ambulatory Visit: Payer: Self-pay | Admitting: Family Medicine

## 2011-12-23 ENCOUNTER — Ambulatory Visit
Admission: RE | Admit: 2011-12-23 | Discharge: 2011-12-23 | Disposition: A | Discharge status: Home / Self Care | Payer: Medicare Other | Source: Ambulatory Visit | Attending: Family Medicine | Admitting: Family Medicine

## 2011-12-23 DIAGNOSIS — M25559 Pain in unspecified hip: Secondary | ICD-10-CM

## 2012-02-14 ENCOUNTER — Other Ambulatory Visit: Payer: Self-pay | Admitting: Family Medicine

## 2012-02-14 DIAGNOSIS — R200 Anesthesia of skin: Secondary | ICD-10-CM

## 2012-02-16 ENCOUNTER — Ambulatory Visit
Admission: RE | Admit: 2012-02-16 | Discharge: 2012-02-16 | Disposition: A | Discharge status: Home / Self Care | Payer: Medicare Other | Source: Ambulatory Visit | Attending: Family Medicine | Admitting: Family Medicine

## 2012-02-16 DIAGNOSIS — R200 Anesthesia of skin: Secondary | ICD-10-CM

## 2012-02-28 ENCOUNTER — Other Ambulatory Visit: Payer: Self-pay | Admitting: Family Medicine

## 2012-02-28 DIAGNOSIS — M542 Cervicalgia: Secondary | ICD-10-CM

## 2012-03-07 ENCOUNTER — Encounter (HOSPITAL_COMMUNITY): Payer: Self-pay | Admitting: *Deleted

## 2012-03-07 ENCOUNTER — Emergency Department (HOSPITAL_COMMUNITY)
Admission: EM | Admit: 2012-03-07 | Discharge: 2012-03-08 | Disposition: A | Discharge status: Home / Self Care | Payer: Medicare Other | Attending: Emergency Medicine | Admitting: Emergency Medicine

## 2012-03-07 DIAGNOSIS — R109 Unspecified abdominal pain: Secondary | ICD-10-CM | POA: Insufficient documentation

## 2012-03-07 DIAGNOSIS — K5732 Diverticulitis of large intestine without perforation or abscess without bleeding: Secondary | ICD-10-CM | POA: Insufficient documentation

## 2012-03-07 DIAGNOSIS — K5792 Diverticulitis of intestine, part unspecified, without perforation or abscess without bleeding: Secondary | ICD-10-CM

## 2012-03-07 DIAGNOSIS — R10819 Abdominal tenderness, unspecified site: Secondary | ICD-10-CM | POA: Insufficient documentation

## 2012-03-07 DIAGNOSIS — Z7982 Long term (current) use of aspirin: Secondary | ICD-10-CM | POA: Insufficient documentation

## 2012-03-07 HISTORY — DX: Dorsalgia, unspecified: M54.9

## 2012-03-07 HISTORY — DX: Polyneuropathy, unspecified: G62.9

## 2012-03-07 HISTORY — DX: Unspecified osteoarthritis, unspecified site: M19.90

## 2012-03-07 HISTORY — DX: Heart failure, unspecified: I50.9

## 2012-03-07 HISTORY — DX: Essential (primary) hypertension: I10

## 2012-03-07 HISTORY — DX: Pure hypercholesterolemia, unspecified: E78.00

## 2012-03-07 LAB — CBC
HCT: 38.5 % (ref 36.0–46.0)
Hemoglobin: 13.5 g/dL (ref 12.0–15.0)
MCHC: 35.1 g/dL (ref 30.0–36.0)
MCV: 94.1 fL (ref 78.0–100.0)
RDW: 13.3 % (ref 11.5–15.5)
WBC: 12.5 10*3/uL — ABNORMAL HIGH (ref 4.0–10.5)

## 2012-03-07 LAB — DIFFERENTIAL
Eosinophils Relative: 1 % (ref 0–5)
Lymphocytes Relative: 23 % (ref 12–46)
Monocytes Absolute: 0.8 10*3/uL (ref 0.1–1.0)
Monocytes Relative: 7 % (ref 3–12)
Neutro Abs: 8.6 10*3/uL — ABNORMAL HIGH (ref 1.7–7.7)

## 2012-03-07 LAB — URINALYSIS, ROUTINE W REFLEX MICROSCOPIC
Bilirubin Urine: NEGATIVE
Glucose, UA: NEGATIVE mg/dL
Ketones, ur: NEGATIVE mg/dL
Specific Gravity, Urine: 1.022 (ref 1.005–1.030)
pH: 5.5 (ref 5.0–8.0)

## 2012-03-07 LAB — URINE MICROSCOPIC-ADD ON

## 2012-03-07 LAB — BASIC METABOLIC PANEL
BUN: 15 mg/dL (ref 6–23)
CO2: 23 mEq/L (ref 19–32)
Chloride: 102 mEq/L (ref 96–112)
Creatinine, Ser: 0.8 mg/dL (ref 0.50–1.10)
GFR calc Af Amer: >90 mL/min (ref >90)
Potassium: 3.8 mEq/L (ref 3.5–5.1)

## 2012-03-07 MED ORDER — ONDANSETRON HCL 4 MG/2ML IJ SOLN
4.0000 mg | Freq: Once | INTRAMUSCULAR | Status: AC
  Administered 2012-03-07: 4 mg via INTRAVENOUS

## 2012-03-07 MED ORDER — MORPHINE SULFATE 4 MG/ML IJ SOLN
4.0000 mg | Freq: Once | INTRAMUSCULAR | Status: AC
  Administered 2012-03-07: 4 mg via INTRAVENOUS
  Filled 2012-03-07: qty 1

## 2012-03-07 MED ORDER — ONDANSETRON HCL 4 MG/2ML IJ SOLN
INTRAMUSCULAR | Status: AC
  Filled 2012-03-07: qty 2

## 2012-03-07 MED ORDER — ONDANSETRON HCL 4 MG/5ML PO SOLN: 4.0000 mg | Freq: Once | ORAL | Status: DC

## 2012-03-07 NOTE — ED Notes (Signed)
C/o low mid abd cramping, onset last Thursday, also facial numbness and HA, poor appetite, sx on & off for 1 monthly, has been seen by MD for same previous, not sure if abd sx r/t bladder infection, no meds PTA, last BM this am (normal), last ate 1230, (denies: nvd, fever or bleeding). abd pain rated 10/10, comes and goes.

## 2012-03-07 NOTE — ED Provider Notes (Signed)
History     CSN: [id number]  Arrival date & time 03/07/12  1836   First MD Initiated Contact with Patient 03/07/12 2300      Chief Complaint  Patient presents with  . Abdominal Pain    Patient is a 61 y.o. female presenting with abdominal pain. The history is provided by the patient.  Abdominal Pain The primary symptoms of the illness include abdominal pain and nausea. The primary symptoms of the illness do not include fever, shortness of breath, vomiting, diarrhea or dysuria. The current episode started more than 2 days ago. The onset of the illness was gradual. The problem has been gradually worsening.  Symptoms associated with the illness do not include back pain.  nothing improves her symptoms Palpation worsens her symptoms  Pt presents for abdominal pain She reports pain for about a week No cp/sob No focal weakness She reports chronic numbness to left face for months that her PCP has already worked this up No recent abdominal surgeries She reports mild constipation  Past Medical History  Diagnosis Date  . CHF (congestive heart failure)   . Diabetes mellitus   . Hypertension   . Hypercholesterolemia   . Back pain   . Arthritis   . Neuropathy     Past Surgical History  Procedure Date  . Back surgery   . Tonsillectomy   . Abdominal hysterectomy   . Knee surgery       History  Substance Use Topics  . Smoking status: Current Everyday Smoker  . Smokeless tobacco: Not on file  . Alcohol Use: Yes    OB History    Grav Para Term Preterm Abortions TAB SAB Ect Mult Living                  Review of Systems  Constitutional: Negative for fever.  Respiratory: Negative for shortness of breath.   Gastrointestinal: Positive for nausea and abdominal pain. Negative for vomiting and diarrhea.  Genitourinary: Negative for dysuria.  Musculoskeletal: Negative for back pain.  All other systems reviewed and are negative.    Allergies  Acetaminophen; Codeine;  Penicillins; and Sulfonamide derivatives  Home Medications   Current Outpatient Rx  Name Route Sig Dispense Refill  . ASPIRIN 81 MG PO CHEW Oral Chew 81 mg by mouth daily.    Marland Kitchen METAXALONE 800 MG PO TABS Oral Take 800 mg by mouth 2 (two) times daily as needed. For muscle spasms      BP 133/82  Pulse 82  Temp(Src) 99.2 F (37.3 C) (Oral)  Resp 19  SpO2 100%  Physical Exam CONSTITUTIONAL: Well developed/well nourished HEAD AND FACE: Normocephalic/atraumatic EYES: EOMI/PERRL ENMT: Mucous membranes moist NECK: supple no meningeal signs SPINE:entire spine nontender CV: S1/S2 noted, no murmurs/rubs/gallops noted LUNGS: Lungs are clear to auscultation bilaterally, no apparent distress ABDOMEN: soft, tenderness to bilateral lower quadrants, tenderness is moderate, no rebound or guarding GU:no cva tenderness NEURO: Pt is awake/alert, moves all extremitiesx4 No facial droop No arm/leg drift EXTREMITIES: pulses normal, full ROM SKIN: warm, color normal PSYCH: no abnormalities of mood noted  ED Course  Procedures   Labs Reviewed  BASIC METABOLIC PANEL - Abnormal; Notable for the following:    Glucose, Bld 116 (*)    GFR calc non Af Amer 79 (*)    All other components within normal limits  CBC - Abnormal; Notable for the following:    WBC 12.5 (*)    All other components within normal limits  DIFFERENTIAL -  Abnormal; Notable for the following:    Neutro Abs 8.6 (*)    All other components within normal limits  URINALYSIS, ROUTINE W REFLEX MICROSCOPIC - Abnormal; Notable for the following:    Hgb urine dipstick TRACE (*)    All other components within normal limits  URINE MICROSCOPIC-ADD ON  11:36 PM Pt with persistent abdominal pain for at least a week, now worsening, will get CT imaging  2:32 AM Pt stable Mild diverticulitis noted Stable for outpatient management  The patient appears reasonably screened and/or stabilized for discharge and I doubt any other medical  condition or other [facility] requiring further screening, evaluation, or treatment in the ED at this time prior to discharge.    MDM  Nursing notes reviewed and considered in documentation All labs/vitals reviewed and considered         Joya Gaskins, MD 03/08/12 [phone]

## 2012-03-08 ENCOUNTER — Emergency Department (HOSPITAL_COMMUNITY): Payer: Medicare Other

## 2012-03-08 ENCOUNTER — Encounter (HOSPITAL_COMMUNITY): Payer: Self-pay | Admitting: Radiology

## 2012-03-08 MED ORDER — CIPROFLOXACIN HCL 500 MG PO TABS
500.0000 mg | ORAL_TABLET | Freq: Once | ORAL | Status: AC
  Administered 2012-03-08: 500 mg via ORAL
  Filled 2012-03-08: qty 1

## 2012-03-08 MED ORDER — METRONIDAZOLE 500 MG PO TABS
500.0000 mg | ORAL_TABLET | Freq: Once | ORAL | Status: AC
  Administered 2012-03-08: 500 mg via ORAL
  Filled 2012-03-08: qty 1

## 2012-03-08 MED ORDER — MORPHINE SULFATE 4 MG/ML IJ SOLN
4.0000 mg | Freq: Once | INTRAMUSCULAR | Status: AC
  Administered 2012-03-08: 4 mg via INTRAVENOUS
  Filled 2012-03-08: qty 1

## 2012-03-08 MED ORDER — MORPHINE SULFATE 4 MG/ML IJ SOLN
4.0000 mg | Freq: Once | INTRAMUSCULAR | Status: AC
  Administered 2012-03-08: 4 mg via INTRAVENOUS

## 2012-03-08 MED ORDER — CIPROFLOXACIN HCL 500 MG PO TABS: 500.0000 mg | ORAL_TABLET | Freq: Two times a day (BID) | ORAL | Status: DC

## 2012-03-08 MED ORDER — IOHEXOL 300 MG/ML  SOLN
100.0000 mL | Freq: Once | INTRAMUSCULAR | Status: AC | PRN
  Administered 2012-03-08: 100 mL via INTRAVENOUS

## 2012-03-08 MED ORDER — METRONIDAZOLE 500 MG PO TABS: 500.0000 mg | ORAL_TABLET | Freq: Two times a day (BID) | ORAL | Status: DC

## 2012-03-08 MED ORDER — MORPHINE SULFATE 4 MG/ML IJ SOLN
INTRAMUSCULAR | Status: AC
  Filled 2012-03-08: qty 1

## 2012-03-08 NOTE — Discharge Instructions (Signed)
Please hold your metformin for two days Be sure to followup after your symptoms resolve as you may need colonoscopy in the future  Diverticulitis Small pockets or "bubbles" can develop in the wall of the intestine. Diverticulitis is when those pockets become infected and inflamed. This causes stomach pain (usually on the left side). HOME CARE  Take all medicine as told by your doctor.   Try a clear liquid diet (broth, tea, or water) for as long as told by your doctor.   Keep all follow-up visits with your doctor.   You may be put on a low-fiber diet once you start feeling better. Here are foods that have low-fiber:   White breads, cereals, rice, and pasta.   Cooked fruits and vegetables or soft fresh fruits and vegetables without the skin.   Ground or well-cooked tender beef, ham, veal, lamb, pork, or poultry.   Eggs and seafood.   After you are doing well on the low-fiber diet, you may be put on a high-fiber diet. Here are ways to increase your fiber:   Choose whole-grain breads, cereals, pasta, and brown rice.   Choose fruits and vegetables with skin on. Do not overcook the vegetables.   Choose nuts, seeds, legumes, dried peas, beans, and lentils.   Look for food products that have more than 3 grams of fiber per serving on the food label.  GET HELP RIGHT AWAY IF:  Your pain does not get better or gets worse.   You have trouble eating food.   You are not pooping (having bowel movements) like normal.   You have a temperature by mouth above 102 F (38.9 C), not controlled by medicine.   You keep throwing up (vomiting).   You have bloody or black, tarry poop (stools).   You are getting worse and not better.  MAKE SURE YOU:   Understand these instructions.   Will watch your condition.   Will get help right away if you are not doing well or get worse.  Document Released: 03/15/2008 Document Revised: 09/16/2011 Document Reviewed: 08/18/2009 [facility] Patient  Information 2012 [place], [place].Low Fiber and Residue Restricted Diet A low fiber diet restricts foods that contain carbohydrates that are not digested in the small intestine. A diet containing about 10 g of fiber is considered low fiber. The diet needs to be individualized to suit patient tolerances and preferences and to avoid unnecessary restrictions. Generally, the foods emphasized in a low fiber diet have no skins or seeds. They may have been processed to remove bran, germ, or husks. Cooking may not necessarily eliminate the fiber. Cooking may, in fact, enable a greater quantity of fiber to be consumed in a lesser volume. Legumes and nuts are also restricted. The term low residue has also been used to describe low fiber diets, although the two are not the same. Residue refers to any substance that adds to bowel (colonic) contents, such as sloughed cells and intestinal bacteria, in addition to fiber. Residue-containing foods, prunes and prune juice, milk, and connective tissue from meats may also need to be eliminated. It is important to eliminate these foods during sudden (acute) attacks of inflammatory bowel disease, when there is a partial obstruction due to another reason, or when minimal fecal output is desired. When these problems are gone, a more normal diet may be used. PURPOSE  Prevent blockage of a partially obstructed or narrowed gastrointestinal tract.   Reduce stool weight and volume.   Slow the movement of waste.  WHEN  IS THIS DIET USED?  Acute phase of Crohn's disease, ulcerative colitis, regional enteritis, or diverticulitis.   Narrowing (stenosis) of intestinal or esophageal tubes (lumina).   Transitional diet following surgery, injury (trauma), or illness.  ADEQUACY This diet is nutritionally adequate based on individual food choices according to the Recommended Dietary Allowances of the [employer]. CHOOSING FOODS Check labels, especially on foods from the  starch list. Often, dietary fiber content is listed with the Nutrition Facts panel.  Breads and Starches  Allowed: White, [place], and pita breads, plain rolls, buns, or sweet rolls, doughnuts, waffles, pancakes, bagels. Plain muffins, sweet breads, biscuits, matzoth. Flour. Soda, saltine, or graham crackers. Pretzels, rusks, melba toast, zwieback. Cooked cereals: cornmeal, farina, cream cereals. Dry cereals: refined corn, wheat, rice, and oat cereals (check label). Potatoes prepared any way without skins, refined macaroni, spaghetti, noodles, refined rice.   Avoid: Bread, rolls, or crackers made with whole-wheat, multigrains, rye, bran seeds, nuts, or coconut. Corn tortillas, table-shells. Corn chips, tortilla chips. Cereals containing whole-grains, multigrains, bran, coconut, nuts, or raisins. Cooked or dry oatmeal. Coarse wheat cereals, granola. Cereals advertised as "high fiber." Potato skins. Whole-grain pasta, wild or brown rice. Popcorn.  Vegetables  Allowed:  Strained tomato and vegetable juices. Fresh: tender lettuce, cucumber, cabbage, spinach, bean sprouts. Cooked, canned: asparagus, bean sprouts, cut green or wax beans, cauliflower, pumpkin, beets, mushrooms, olives, spinach, yellow squash, tomato, tomato sauce (no seeds), zucchini (peeled), turnips. Canned sweet potatoes. Small amounts of celery, onion, radish, and green pepper may be used. Keep servings limited to  cup.   Avoid: Fresh, cooked, or canned: artichokes, baked beans, beet greens, broccoli, Brussels sprouts, French-style green beans, corn, kale, legumes, peas, sweet potatoes. Cooked: green or red cabbage, spinach. Avoid large servings of any vegetables.  Fruit  Allowed:  All fruit juices except prune juice. Cooked or canned: apricots applesauce, cantaloupe, cherries, grapefruit, grapes, kiwi, mandarin oranges, peaches, pears, fruit cocktail, pineapple, plums, watermelon. Fresh: banana, grapes, cantaloupe, avocado, cherries,  pineapple, grapefruit, kiwi, nectarines, peaches, oranges, blueberries, plums. Keep servings limited to  cup or 1 piece.   Avoid: Fresh: apple with or without skin, apricots, mango, pears, raspberries, strawberries. Prune juice, stewed or dried prunes. Dried fruits, raisins, dates. Avoid large servings of all fresh fruits.  Meat and Meat Substitutes  Allowed:  Ground or well-cooked tender beef, ham, veal, lamb, pork, or poultry. Eggs, plain cheese. Fish, oysters, shrimp, lobster, other seafood. Liver, organ meats.   Avoid: Tough, fibrous meats with gristle. Peanut butter, smooth or chunky. Cheese with seeds, nuts, or other foods not allowed. Nuts, seeds, legumes, dried peas, beans, lentils.  Milk  Allowed:  All milk products except those not allowed. Milk and milk product consumption should be minimal when low residue is desired.   Avoid: Yogurt that contains nuts or seeds.  Soups and Combination Foods  Allowed:  Bouillon, broth, or cream soups made from allowed foods. Any strained soup. Casseroles or mixed dishes made with allowed foods.   Avoid: Soups made from vegetables that are not allowed or that contain other foods not allowed.  Desserts and Sweets  Allowed:  Plain cakes and cookies, pie made with allowed fruit, pudding, custard, cream pie. Gelatin, fruit, ice, sherbet, frozen ice pops. Ice cream, ice milk without nuts. Plain hard candy, honey, jelly, molasses, syrup, sugar, chocolate syrup, gumdrops, marshmallows.   Avoid: Desserts, cookies, or candies that contain nuts, peanut butter, or dried fruits. Jams, preserves with seeds, marmalade.  Fats and Oils  Allowed:  Margarine, butter, cream, mayonnaise, salad oils, plain salad dressings made from allowed foods. Plain gravy, crisp bacon without rind.   Avoid: Seeds, nuts, olives. Avocados.  Beverages  Allowed:  All, except those listed to avoid.   Avoid: Fruit juices with high pulp, prune juice.  Condiments  Allowed:   Ketchup, mustard, horseradish, vinegar, cream sauce, cheese sauce, cocoa powder. Spices in moderation: allspice, basil, bay leaves, celery powder or leaves, cinnamon, cumin powder, curry powder, ginger, mace, marjoram, onion or garlic powder, oregano, paprika, parsley flakes, ground pepper, rosemary, sage, savory, tarragon, thyme, turmeric.   Avoid: Coconut, pickles.  SAMPLE MEAL PLAN The following menu is provided as a sample. Your daily menu plans will vary. Be sure to include a minimum of the following each day in order to provide essential nutrients for the adult:  Starch/Bread/Cereal Group, 6 servings.   Fruit/Vegetable Group, 5 servings.   Meat/Meat Substitute Group, 2 servings.   Milk/Milk Substitute Group, 2 servings.  A serving is equal to  cup for fruits, vegetables, and cooked cereals or 1 piece for foods such as a piece of bread, 1 orange, or 1 apple. For dry cereals and crackers, use serving sizes listed on the label. Combination foods may count as full or partial servings from various food groups. Fats, desserts, and sweets may be added to the meal plan after the requirements for essential nutrients are met. SAMPLE MENU Breakfast   cup orange juice.   1 boiled egg.   1 slice white toast.   Margarine.    cup cornflakes.   1 cup milk.   Beverage.  Lunch   cup chicken noodle soup.   2 to 3 oz sliced roast beef.   2 slices seedless rye bread.   Mayonnaise.    cup tomato juice.   1 small banana.   Beverage.  Dinner  3 oz baked chicken.    cup scalloped potatoes.    cup cooked beets.   White dinner roll.   Margarine.    cup canned peaches.   Beverage.  Document Released: 03/19/2002 Document Revised: 09/16/2011 Document Reviewed: 08/30/2011 [facility] Patient Information 2012 [place], [place].

## 2012-03-10 ENCOUNTER — Emergency Department (HOSPITAL_COMMUNITY)
Admission: EM | Admit: 2012-03-10 | Discharge: 2012-03-10 | Disposition: A | Discharge status: Home / Self Care | Payer: Medicare Other | Attending: Emergency Medicine | Admitting: Emergency Medicine

## 2012-03-10 ENCOUNTER — Encounter (HOSPITAL_COMMUNITY): Payer: Self-pay

## 2012-03-10 DIAGNOSIS — F17200 Nicotine dependence, unspecified, uncomplicated: Secondary | ICD-10-CM | POA: Insufficient documentation

## 2012-03-10 DIAGNOSIS — K5792 Diverticulitis of intestine, part unspecified, without perforation or abscess without bleeding: Secondary | ICD-10-CM

## 2012-03-10 DIAGNOSIS — M549 Dorsalgia, unspecified: Secondary | ICD-10-CM | POA: Insufficient documentation

## 2012-03-10 DIAGNOSIS — Z79899 Other long term (current) drug therapy: Secondary | ICD-10-CM | POA: Insufficient documentation

## 2012-03-10 DIAGNOSIS — I10 Essential (primary) hypertension: Secondary | ICD-10-CM | POA: Insufficient documentation

## 2012-03-10 DIAGNOSIS — E1142 Type 2 diabetes mellitus with diabetic polyneuropathy: Secondary | ICD-10-CM | POA: Insufficient documentation

## 2012-03-10 DIAGNOSIS — E1149 Type 2 diabetes mellitus with other diabetic neurological complication: Secondary | ICD-10-CM | POA: Insufficient documentation

## 2012-03-10 DIAGNOSIS — E785 Hyperlipidemia, unspecified: Secondary | ICD-10-CM | POA: Insufficient documentation

## 2012-03-10 DIAGNOSIS — K5732 Diverticulitis of large intestine without perforation or abscess without bleeding: Secondary | ICD-10-CM | POA: Insufficient documentation

## 2012-03-10 DIAGNOSIS — I509 Heart failure, unspecified: Secondary | ICD-10-CM | POA: Insufficient documentation

## 2012-03-10 LAB — BASIC METABOLIC PANEL
BUN: 6 mg/dL (ref 6–23)
Chloride: 103 mEq/L (ref 96–112)
GFR calc Af Amer: >90 mL/min (ref >90)
Potassium: 3.6 mEq/L (ref 3.5–5.1)
Sodium: 140 mEq/L (ref 135–145)

## 2012-03-10 LAB — CBC
HCT: 35 % — ABNORMAL LOW (ref 36.0–46.0)
Hemoglobin: 12.2 g/dL (ref 12.0–15.0)
RBC: 3.71 MIL/uL — ABNORMAL LOW (ref 3.87–5.11)
WBC: 8.3 10*3/uL (ref 4.0–10.5)

## 2012-03-10 MED ORDER — OXYCODONE-ACETAMINOPHEN 5-325 MG PO TABS: 1.0000 | ORAL_TABLET | ORAL | Status: DC | PRN

## 2012-03-10 MED ORDER — OXYCODONE-ACETAMINOPHEN 7.5-325 MG PO TABS: 1.0000 | ORAL_TABLET | ORAL | Status: AC | PRN

## 2012-03-10 MED ORDER — OXYCODONE-ACETAMINOPHEN 5-325 MG PO TABS
1.0000 | ORAL_TABLET | Freq: Once | ORAL | Status: AC
  Administered 2012-03-10: 1 via ORAL
  Filled 2012-03-10: qty 1

## 2012-03-10 NOTE — ED Provider Notes (Signed)
After treatment in the ED the patient feels back to baseline and wants to go home.   Nelia Shi, MD 03/10/12 [phone]

## 2012-03-10 NOTE — ED Notes (Addendum)
Patient states she was diagnosed on Tuesday with diverticulitis and the pain not getting any better and diarrhea x 2 days. Patient c/o nausea and denies vomiting or fever. Patient state she has no appatite and has only eaten soup x 2 days.

## 2012-03-10 NOTE — ED Provider Notes (Signed)
History     CSN: [id number]  Arrival date & time 03/10/12  1031   First MD Initiated Contact with Patient 03/10/12 1041      Chief Complaint  Patient presents with  . Abdominal Pain    (Consider location/radiation/quality/duration/timing/severity/associated sxs/prior treatment) HPI The patient is a 61 yo woman, presenting with abdominal pain.  The patient was seen in the ED 3 days ago for abdominal pain, found to have a WBC count of 12.5, and CT evidence of diverticulitis.  She was sent home with cipro/flagyl, and instructed to continue her home oxycodone for pain.  She continues to note symptoms of LLQ cramp abdominal pain, worse with bending over (putting pressure on the area), and worse during BM.  She notes loose BM's once/day for the last 3 days, with no blood or melena in the stool.  She notes some nausea, but no vomiting, with poor PO intake of food over the last few days (though she notes drinking a lot of water).  No fevers.  She reports taking her antibiotics as prescribed.  As her symptoms have not improved and appear to have worsened somewhat, she re-presented to the ED.  Past Medical History  Diagnosis Date  . CHF (congestive heart failure)   . Diabetes mellitus   . Hypertension   . Hypercholesterolemia   . Back pain   . Arthritis   . Neuropathy     Past Surgical History  Procedure Date  . Back surgery   . Tonsillectomy   . Abdominal hysterectomy   . Knee surgery     No family history on file.  History  Substance Use Topics  . Smoking status: Current Everyday Smoker  . Smokeless tobacco: Not on file  . Alcohol Use: Yes    OB History    Grav Para Term Preterm Abortions TAB SAB Ect Mult Living                  Review of Systems ROS General: no fevers, chills, changes in weight Skin: no rash HEENT: no blurry vision, hearing changes, sore throat Pulm: no dyspnea, coughing, wheezing CV: no chest pain, palpitations, shortness of breath Abd: see  HPI GU: no dysuria, hematuria, polyuria Ext: no arthralgias, myalgias Neuro: no weakness, numbness, or tingling  Allergies  Acetaminophen; Codeine; Penicillins; and Sulfonamide derivatives  Home Medications   Current Outpatient Rx  Name Route Sig Dispense Refill  . ASPIRIN 325 MG PO TBEC Oral Take 325 mg by mouth daily.    Marland Kitchen CIPROFLOXACIN HCL 500 MG PO TABS Oral Take 500 mg by mouth every 12 (twelve) hours.    . CLOPIDOGREL BISULFATE 75 MG PO TABS Oral Take 75 mg by mouth daily.    . CYCLOBENZAPRINE HCL 10 MG PO TABS Oral Take 10 mg by mouth at bedtime as needed. For muscle spasms    . DICLOFENAC SODIUM 1 % TD GEL Topical Apply 1 application topically daily as needed. For pain    . METRONIDAZOLE 500 MG PO TABS Oral Take 500 mg by mouth 2 (two) times daily.    . OXYCODONE-ACETAMINOPHEN 5-325 MG PO TABS Oral Take 1 tablet by mouth every 12 (twelve) hours as needed. For pain      BP 138/91  Pulse 71  Temp(Src) 98 F (36.7 C) (Oral)  Resp 16  SpO2 98%  Physical Exam General: alert, cooperative, appears somewhat uncomfortable HEENT: pupils equal round and reactive to light, vision grossly intact, oropharynx clear and non-erythematous  Neck: supple,  no lymphadenopathy Lungs: clear to ascultation bilaterally, normal work of respiration, no wheezes, rales, ronchi Heart: regular rate and rhythm, no murmurs, gallops, or rubs Abdomen: soft, maximally tender to LLQ palpation, non-distended, +bs, no guarding or rebound tenderness Extremities: no cyanosis, clubbing, or edema Neurologic: alert & oriented X3, cranial nerves II-XII intact, strength grossly intact, sensation intact to light touch  ED Course  Procedures (including critical care time)   Labs Reviewed  BASIC METABOLIC PANEL  CBC   No results found.   No diagnosis found.    MDM   # Diverticulitis - the patient re-presents after diagnosis with diverticulitis 3 days ago, with persistent and mildly increased pain  despite antibiotic treatment at home.  As this patient has failed a home trial of medications, she may need hospital admission. -cbc, bmet -oxycodone for pain  Addendum 12:45 pm - labs show normalization of leukocytosis, likely representing improvement in diverticulitis.  Patient's pain control was likely inadequate in the outpatient setting, as the patient did not increase her chronic pain medications.  We will discharge to complete course of antibiotics, with increased pain medications.  Linward Headland, MD 03/10/12 [phone]

## 2012-03-10 NOTE — ED Notes (Signed)
Lower abdominal pain .  Pt. Reports that she was seen on Tuesday and was newly diagnosed with diverticultitis.  Pt. Was told that if she did not improve to come back.  Pt. Reports that she has pain when she voids also

## 2012-03-10 NOTE — Discharge Instructions (Signed)
Based on the labs we obtained, your diverticulitis is improving.  We can increase your pain medication to improve pain control while this episode resolves.  Take Oxycodone, 7.5 mg, 1 tablet up to every 4-6 hours as needed for pain.  Diverticulitis A diverticulum is a small pouch or sac on the colon. Diverticulosis is the presence of these diverticula on the colon. Diverticulitis is the irritation (inflammation) or infection of diverticula. CAUSES  The colon and its diverticula contain bacteria. If food particles block the tiny opening to a diverticulum, the bacteria inside can grow and cause an increase in pressure. This leads to infection and inflammation and is called diverticulitis. SYMPTOMS   Abdominal pain and tenderness. Usually, the pain is located on the left side of your abdomen. However, it could be located elsewhere.   Fever.   Bloating.   Feeling sick to your stomach (nausea).   Throwing up (vomiting).   Abnormal stools.  DIAGNOSIS  Your caregiver will take a history and perform a physical exam. Since many things can cause abdominal pain, other tests may be necessary. Tests may include:  Blood tests.   Urine tests.   X-ray of the abdomen.   CT scan of the abdomen.  Sometimes, surgery is needed to determine if diverticulitis or other conditions are causing your symptoms. TREATMENT  Most of the time, you can be treated without surgery. Treatment includes:  Resting the bowels by only having liquids for a few days. As you improve, you will need to eat a low-fiber diet.   Intravenous (IV) fluids if you are losing body fluids (dehydrated).   Antibiotic medicines that treat infections may be given.   Pain and nausea medicine, if needed.   Surgery if the inflamed diverticulum has burst.  HOME CARE INSTRUCTIONS   Try a clear liquid diet (broth, tea, or water for as long as directed by your caregiver). You may then gradually begin a low-fiber diet as tolerated. A  low-fiber diet is a diet with less than 10 grams of fiber. Choose the foods below to reduce fiber in the diet:   White breads, cereals, rice, and pasta.   Cooked fruits and vegetables or soft fresh fruits and vegetables without the skin.   Ground or well-cooked tender beef, ham, veal, lamb, pork, or poultry.   Eggs and seafood.   After your diverticulitis symptoms have improved, your caregiver may put you on a high-fiber diet. A high-fiber diet includes 14 grams of fiber for every 1000 calories consumed. For a standard 2000 calorie diet, you would need 28 grams of fiber. Follow these diet guidelines to help you increase the fiber in your diet. It is important to slowly increase the amount fiber in your diet to avoid gas, constipation, and bloating.   Choose whole-grain breads, cereals, pasta, and Milee Qualls rice.   Choose fresh fruits and vegetables with the skin on. Do not overcook vegetables because the more vegetables are cooked, the more fiber is lost.   Choose more nuts, seeds, legumes, dried peas, beans, and lentils.   Look for food products that have greater than 3 grams of fiber per serving on the Nutrition Facts label.   Take all medicine as directed by your caregiver.   If your caregiver has given you a follow-up appointment, it is very important that you go. Not going could result in lasting (chronic) or permanent injury, pain, and disability. If there is any problem keeping the appointment, call to reschedule.  SEEK MEDICAL CARE IF:  Your pain does not improve.   You have a hard time advancing your diet beyond clear liquids.   Your bowel movements do not return to normal.  SEEK IMMEDIATE MEDICAL CARE IF:   Your pain becomes worse.   You have an oral temperature above 102 F (38.9 C), not controlled by medicine.   You have repeated vomiting.   You have bloody or black, tarry stools.   Symptoms that brought you to your caregiver become worse or are not getting better.    MAKE SURE YOU:   Understand these instructions.   Will watch your condition.   Will get help right away if you are not doing well or get worse.  Document Released: 07/07/2005 Document Revised: 09/16/2011 Document Reviewed: 11/02/2010 [facility] Patient Information 2012 [place], [place].Diverticulitis A diverticulum is a small pouch or sac on the colon. Diverticulosis is the presence of these diverticula on the colon. Diverticulitis is the irritation (inflammation) or infection of diverticula. CAUSES  The colon and its diverticula contain bacteria. If food particles block the tiny opening to a diverticulum, the bacteria inside can grow and cause an increase in pressure. This leads to infection and inflammation and is called diverticulitis. SYMPTOMS   Abdominal pain and tenderness. Usually, the pain is located on the left side of your abdomen. However, it could be located elsewhere.   Fever.   Bloating.   Feeling sick to your stomach (nausea).   Throwing up (vomiting).   Abnormal stools.  DIAGNOSIS  Your caregiver will take a history and perform a physical exam. Since many things can cause abdominal pain, other tests may be necessary. Tests may include:  Blood tests.   Urine tests.   X-ray of the abdomen.   CT scan of the abdomen.  Sometimes, surgery is needed to determine if diverticulitis or other conditions are causing your symptoms. TREATMENT  Most of the time, you can be treated without surgery. Treatment includes:  Resting the bowels by only having liquids for a few days. As you improve, you will need to eat a low-fiber diet.   Intravenous (IV) fluids if you are losing body fluids (dehydrated).   Antibiotic medicines that treat infections may be given.   Pain and nausea medicine, if needed.   Surgery if the inflamed diverticulum has burst.  HOME CARE INSTRUCTIONS   Try a clear liquid diet (broth, tea, or water for as long as directed by your caregiver). You may  then gradually begin a low-fiber diet as tolerated. A low-fiber diet is a diet with less than 10 grams of fiber. Choose the foods below to reduce fiber in the diet:   White breads, cereals, rice, and pasta.   Cooked fruits and vegetables or soft fresh fruits and vegetables without the skin.   Ground or well-cooked tender beef, ham, veal, lamb, pork, or poultry.   Eggs and seafood.   After your diverticulitis symptoms have improved, your caregiver may put you on a high-fiber diet. A high-fiber diet includes 14 grams of fiber for every 1000 calories consumed. For a standard 2000 calorie diet, you would need 28 grams of fiber. Follow these diet guidelines to help you increase the fiber in your diet. It is important to slowly increase the amount fiber in your diet to avoid gas, constipation, and bloating.   Choose whole-grain breads, cereals, pasta, and Izick Gasbarro rice.   Choose fresh fruits and vegetables with the skin on. Do not overcook vegetables because the more vegetables are cooked, the more  fiber is lost.   Choose more nuts, seeds, legumes, dried peas, beans, and lentils.   Look for food products that have greater than 3 grams of fiber per serving on the Nutrition Facts label.   Take all medicine as directed by your caregiver.   If your caregiver has given you a follow-up appointment, it is very important that you go. Not going could result in lasting (chronic) or permanent injury, pain, and disability. If there is any problem keeping the appointment, call to reschedule.  SEEK MEDICAL CARE IF:   Your pain does not improve.   You have a hard time advancing your diet beyond clear liquids.   Your bowel movements do not return to normal.  SEEK IMMEDIATE MEDICAL CARE IF:   Your pain becomes worse.   You have an oral temperature above 102 F (38.9 C), not controlled by medicine.   You have repeated vomiting.   You have bloody or black, tarry stools.   Symptoms that brought you to  your caregiver become worse or are not getting better.  MAKE SURE YOU:   Understand these instructions.   Will watch your condition.   Will get help right away if you are not doing well or get worse.  Document Released: 07/07/2005 Document Revised: 09/16/2011 Document Reviewed: 11/02/2010 [facility] Patient Information 2012 [place], [place].

## 2012-03-13 ENCOUNTER — Ambulatory Visit
Admission: RE | Admit: 2012-03-13 | Discharge: 2012-03-13 | Disposition: A | Discharge status: Home / Self Care | Payer: Medicare Other | Source: Ambulatory Visit | Attending: Family Medicine | Admitting: Family Medicine

## 2012-03-13 DIAGNOSIS — M542 Cervicalgia: Secondary | ICD-10-CM

## 2012-08-09 ENCOUNTER — Emergency Department (HOSPITAL_COMMUNITY)
Admission: EM | Admit: 2012-08-09 | Discharge: 2012-08-09 | Disposition: A | Discharge status: Home / Self Care | Payer: Medicare Other | Attending: Emergency Medicine | Admitting: Emergency Medicine

## 2012-08-09 ENCOUNTER — Emergency Department (HOSPITAL_COMMUNITY): Payer: Medicare Other

## 2012-08-09 ENCOUNTER — Encounter (HOSPITAL_COMMUNITY): Payer: Self-pay | Admitting: Emergency Medicine

## 2012-08-09 DIAGNOSIS — Z7902 Long term (current) use of antithrombotics/antiplatelets: Secondary | ICD-10-CM | POA: Insufficient documentation

## 2012-08-09 DIAGNOSIS — I10 Essential (primary) hypertension: Secondary | ICD-10-CM | POA: Insufficient documentation

## 2012-08-09 DIAGNOSIS — I509 Heart failure, unspecified: Secondary | ICD-10-CM | POA: Insufficient documentation

## 2012-08-09 DIAGNOSIS — E119 Type 2 diabetes mellitus without complications: Secondary | ICD-10-CM | POA: Insufficient documentation

## 2012-08-09 DIAGNOSIS — Z79899 Other long term (current) drug therapy: Secondary | ICD-10-CM | POA: Insufficient documentation

## 2012-08-09 DIAGNOSIS — Z8669 Personal history of other diseases of the nervous system and sense organs: Secondary | ICD-10-CM | POA: Insufficient documentation

## 2012-08-09 DIAGNOSIS — F17200 Nicotine dependence, unspecified, uncomplicated: Secondary | ICD-10-CM | POA: Insufficient documentation

## 2012-08-09 DIAGNOSIS — R51 Headache: Secondary | ICD-10-CM | POA: Insufficient documentation

## 2012-08-09 DIAGNOSIS — E7800 Pure hypercholesterolemia, unspecified: Secondary | ICD-10-CM | POA: Insufficient documentation

## 2012-08-09 DIAGNOSIS — R0602 Shortness of breath: Secondary | ICD-10-CM | POA: Insufficient documentation

## 2012-08-09 DIAGNOSIS — R200 Anesthesia of skin: Secondary | ICD-10-CM

## 2012-08-09 DIAGNOSIS — Z7982 Long term (current) use of aspirin: Secondary | ICD-10-CM | POA: Insufficient documentation

## 2012-08-09 DIAGNOSIS — R209 Unspecified disturbances of skin sensation: Secondary | ICD-10-CM | POA: Insufficient documentation

## 2012-08-09 DIAGNOSIS — Z8739 Personal history of other diseases of the musculoskeletal system and connective tissue: Secondary | ICD-10-CM | POA: Insufficient documentation

## 2012-08-09 LAB — GLUCOSE, CAPILLARY: Glucose-Capillary: 101 mg/dL — ABNORMAL HIGH (ref 70–99)

## 2012-08-09 LAB — BASIC METABOLIC PANEL
CO2: 26 mEq/L (ref 19–32)
Calcium: 9.6 mg/dL (ref 8.4–10.5)
Chloride: 104 mEq/L (ref 96–112)
Creatinine, Ser: 0.96 mg/dL (ref 0.50–1.10)
Glucose, Bld: 114 mg/dL — ABNORMAL HIGH (ref 70–99)

## 2012-08-09 LAB — CBC
HCT: 40.3 % (ref 36.0–46.0)
Hemoglobin: 13.8 g/dL (ref 12.0–15.0)
MCH: 32.9 pg (ref 26.0–34.0)
MCV: 96 fL (ref 78.0–100.0)
RBC: 4.2 MIL/uL (ref 3.87–5.11)
WBC: 9.8 10*3/uL (ref 4.0–10.5)

## 2012-08-09 LAB — POCT I-STAT TROPONIN I

## 2012-08-09 NOTE — ED Notes (Signed)
Patient states "has had numbness in left arm and side of face x 4 days".  Patient alert and oriented x 4 on arrival.  No distress noted.

## 2012-08-09 NOTE — ED Notes (Signed)
To triage room on arrival for EKG and evaluation

## 2012-08-09 NOTE — ED Provider Notes (Signed)
History     CSN: [id number]  Arrival date & time 08/09/12  1552   First MD Initiated Contact with Patient 08/09/12 1851      Chief Complaint  Patient presents with  . Numbness  . Headache  . Shortness of Breath    (Consider location/radiation/quality/duration/timing/severity/associated sxs/prior treatment) HPI  61 year old female with history of CHF, diabetes, and hypertension presents complaining of left-sided facial numbness.  Patient reports for the past 3 days she has been experiencing left-sided facial numbness which radiates down to her left arm. Onset was gradual, persistent, nothing makes it better or worse, mild to moderate in severity, with associated headache, and chest tightness,  patient has not tried anything to alleviate her symptoms. She denies fever, chills, vision changes, neck pain or neck stiffness, nausea, vomiting, diarrhea, abdominal pain, dysuria, or rash. Patient reports she has had similar symptoms like this in the past. States she had a brain MRI done about 3 months ago that doesn't show any acute finding. She also reports having cardiac stress test performed several years ago that shows no acute finding. Patient currently denies any chest pain. She denies difficulty thinking, trouble ambulating, or dizziness. She has a history of CHF but denies any increase weight gain. She has a smoking history.  Past Medical History  Diagnosis Date  . CHF (congestive heart failure)   . Diabetes mellitus   . Hypertension   . Hypercholesterolemia   . Back pain   . Arthritis   . Neuropathy     Past Surgical History  Procedure Date  . Back surgery   . Tonsillectomy   . Abdominal hysterectomy   . Knee surgery     History reviewed. No pertinent family history.  History  Substance Use Topics  . Smoking status: Current Every Day Smoker  . Smokeless tobacco: Not on file  . Alcohol Use: Yes    OB History    Grav Para Term Preterm Abortions TAB SAB Ect Mult Living                   Review of Systems  All other systems reviewed and are negative.    Allergies  Acetaminophen; Codeine; Penicillins; and Sulfonamide derivatives  Home Medications   Current Outpatient Rx  Name Route Sig Dispense Refill  . ASPIRIN 325 MG PO TBEC Oral Take 325 mg by mouth daily.    Marland Kitchen CLOPIDOGREL BISULFATE 75 MG PO TABS Oral Take 75 mg by mouth daily.    . CYCLOBENZAPRINE HCL 10 MG PO TABS Oral Take 10 mg by mouth at bedtime as needed. For muscle spasms    . DICLOFENAC SODIUM 1 % TD GEL Topical Apply 1 application topically daily as needed. For pain    . OXYCODONE-ACETAMINOPHEN 5-325 MG PO TABS Oral Take 1 tablet by mouth every 12 (twelve) hours as needed. For pain      BP 140/97  Pulse 59  Temp 98.6 F (37 C) (Oral)  Resp 18  SpO2 95%  Physical Exam  Nursing note and vitals reviewed. Constitutional: She is oriented to person, place, and time. She appears well-developed and well-nourished. No distress.       Awake, alert, nontoxic appearance  HENT:  Head: Normocephalic and atraumatic.  Right Ear: External ear normal.  Left Ear: External ear normal.  Nose: Nose normal.  Mouth/Throat: Oropharynx is clear and moist. No oropharyngeal exudate.  Eyes: Conjunctivae normal and EOM are normal. Pupils are equal, round, and reactive to light. Right eye  exhibits no discharge. Left eye exhibits no discharge.  Neck: Normal range of motion. Neck supple.       No nuchal rigidity  Cardiovascular: Normal rate and regular rhythm.  Exam reveals no gallop and no friction rub.   No murmur heard. Pulmonary/Chest: Effort normal. No respiratory distress. She has no wheezes. She has no rales. She exhibits no tenderness.  Abdominal: Soft. There is no tenderness. There is no rebound.  Musculoskeletal: Normal range of motion. She exhibits no edema and no tenderness.       ROM appears intact, no obvious focal weakness  Lymphadenopathy:    She has no cervical adenopathy.    Neurological: She is alert and oriented to person, place, and time. She has normal reflexes. She displays normal reflexes. A cranial nerve deficit (Subjective left facial paresthesia on light touch, however no facial droop,, no tongue deviation, normal muscular strength, and full range of neck movement) is present. She exhibits normal muscle tone. Coordination normal.       Mental status and motor strength appears intact  Normal gait. normal grip strength  Skin: Skin is warm. No rash noted.  Psychiatric: She has a normal mood and affect.    ED Course  Procedures (including critical care time)  Labs Reviewed  BASIC METABOLIC PANEL - Abnormal; Notable for the following:    Glucose, Bld 114 (*)     GFR calc non Af Amer 63 (*)     GFR calc Af Amer 73 (*)     All other components within normal limits  GLUCOSE, CAPILLARY - Abnormal; Notable for the following:    Glucose-Capillary 101 (*)     All other components within normal limits  CBC  PROTIME-INR  POCT I-STAT TROPONIN I   Dg Chest 2 View  08/09/2012  *RADIOLOGY REPORT*  Clinical Data: Numbness, shortness of breath  CHEST - 2 VIEW  Comparison: 12/19/2009  Findings: Cardiomediastinal silhouette is stable.  No acute infiltrate or pulmonary edema.  There is interval healed fracture of the right fourth rib.  Stable mild degenerative changes thoracic spine.  IMPRESSION: No active disease.  Interval healed fracture of the right fourth rib.   Original Report Authenticated By: Natasha Mead, M.D.    Results for orders placed during the hospital encounter of 08/09/12  CBC      Component Value Range   WBC 9.8  4.0 - 10.5 K/uL   RBC 4.20  3.87 - 5.11 MIL/uL   Hemoglobin 13.8  12.0 - 15.0 g/dL   HCT 16.1  09.6 - 04.5 %   MCV 96.0  78.0 - 100.0 fL   MCH 32.9  26.0 - 34.0 pg   MCHC 34.2  30.0 - 36.0 g/dL   RDW 40.9  81.1 - 91.4 %   Platelets 281  150 - 400 K/uL  BASIC METABOLIC PANEL      Component Value Range   Sodium 141  135 - 145 mEq/L    Potassium 4.0  3.5 - 5.1 mEq/L   Chloride 104  96 - 112 mEq/L   CO2 26  19 - 32 mEq/L   Glucose, Bld 114 (*) 70 - 99 mg/dL   BUN 12  6 - 23 mg/dL   Creatinine, Ser 7.82  0.50 - 1.10 mg/dL   Calcium 9.6  8.4 - 95.6 mg/dL   GFR calc non Af Amer 63 (*) >90 mL/min   GFR calc Af Amer 73 (*) >90 mL/min  PROTIME-INR  Component Value Range   Prothrombin Time 12.3  11.6 - 15.2 seconds   INR 0.92  0.00 - 1.49  POCT I-STAT TROPONIN I      Component Value Range   Troponin i, poc 0.01  0.00 - 0.08 ng/mL   Comment 3           GLUCOSE, CAPILLARY      Component Value Range   Glucose-Capillary 101 (*) 70 - 99 mg/dL   Dg Chest 2 View  16/07/9603  *RADIOLOGY REPORT*  Clinical Data: Numbness, shortness of breath  CHEST - 2 VIEW  Comparison: 12/19/2009  Findings: Cardiomediastinal silhouette is stable.  No acute infiltrate or pulmonary edema.  There is interval healed fracture of the right fourth rib.  Stable mild degenerative changes thoracic spine.  IMPRESSION: No active disease.  Interval healed fracture of the right fourth rib.   Original Report Authenticated By: Natasha Mead, M.D.    Ct Head Wo Contrast  08/09/2012  *RADIOLOGY REPORT*  Clinical Data: Headache and facial numbness.  CT HEAD WITHOUT CONTRAST  Technique:  Contiguous axial images were obtained from the base of the skull through the vertex without contrast.  Comparison: CT head without contrast 02/16/2012.  Findings: No acute intracranial abnormality is present. Specifically, there is no evidence for acute infarct, hemorrhage, mass, hydrocephalus, or extra-axial fluid collection.  The paranasal sinuses and mastoid air cells are clear.  The globes and orbits are intact.  The osseous skull is intact.  IMPRESSION: Negative CT head.   Original Report Authenticated By: Jamesetta Orleans. MATTERN, M.D.       Date: 08/09/2012  Rate: 95  Rhythm: normal sinus rhythm  QRS Axis: normal  Intervals: normal  ST/T Wave abnormalities: normal  Conduction  Disutrbances: none  Narrative Interpretation:   Old EKG Reviewed: No significant changes noted  1. Facial numbness   MDM  Patient presents with left-sided facial numbness which radiates down to her left arm. Examination is unremarkable. She has normal strength and sensation to all 4 extremities. She is neurovascularly intact. She does have some subjective paresthesia to the left side of face.  She is Romberg negative with normal gait.  9:27 PM Pt has neg head CT, ECG unremarkable.  Continues to endorse positive sxs of facial tingling sensation.  My attending has evaluated pt and felt pt best benefit from close follow up with Dr. Loleta Chance for further care.  Pt is to continue taking her ASA as recommended.  Pt voice understanding and agrees with plan.    BP 135/71  Pulse 71  Temp 98.6 F (37 C) (Oral)  Resp 24  SpO2 94%  I have reviewed nursing notes and vital signs. I personally reviewed the imaging tests through PACS system  I reviewed available ER/hospitalization records thought the EMR     Fayrene Helper, [place] 08/09/12 2150

## 2012-08-09 NOTE — ED Notes (Signed)
Pt c/o left sided facial and arm numbness and pain x 4 days; pt sts some SOB and HA starting yesterday

## 2012-08-10 NOTE — ED Provider Notes (Signed)
Medical screening examination/treatment/procedure(s) were conducted as a shared visit with non-physician practitioner(s) and myself.  I personally evaluated the patient during the encounter  Her symptoms are more consistent with paresthesias.  She has an abnormal sensation in her face and left arm.  She has no numbness or left arm or face.  She has normal strength in her bilateral upper lower extremities.  I recommended that she continue her aspirin followup with her primary care doctor.  I do not think she needs to be admitted to the hospital for an MRI scan.  Lyanne Co, MD 08/10/12 [phone]

## 2014-09-19 ENCOUNTER — Emergency Department (HOSPITAL_COMMUNITY)
Admission: EM | Admit: 2014-09-19 | Discharge: 2014-09-19 | Disposition: A | Discharge status: Home / Self Care | Payer: Medicare (Managed Care) | Attending: Emergency Medicine | Admitting: Emergency Medicine

## 2014-09-19 ENCOUNTER — Emergency Department (HOSPITAL_COMMUNITY): Payer: Medicare (Managed Care)

## 2014-09-19 ENCOUNTER — Encounter (HOSPITAL_COMMUNITY): Payer: Self-pay

## 2014-09-19 DIAGNOSIS — Z880 Allergy status to penicillin: Secondary | ICD-10-CM | POA: Diagnosis not present

## 2014-09-19 DIAGNOSIS — I509 Heart failure, unspecified: Secondary | ICD-10-CM | POA: Diagnosis not present

## 2014-09-19 DIAGNOSIS — Z7902 Long term (current) use of antithrombotics/antiplatelets: Secondary | ICD-10-CM | POA: Diagnosis not present

## 2014-09-19 DIAGNOSIS — Z7982 Long term (current) use of aspirin: Secondary | ICD-10-CM | POA: Insufficient documentation

## 2014-09-19 DIAGNOSIS — R000 Tachycardia, unspecified: Secondary | ICD-10-CM | POA: Diagnosis not present

## 2014-09-19 DIAGNOSIS — Z720 Tobacco use: Secondary | ICD-10-CM | POA: Insufficient documentation

## 2014-09-19 DIAGNOSIS — M545 Low back pain: Secondary | ICD-10-CM | POA: Insufficient documentation

## 2014-09-19 DIAGNOSIS — I10 Essential (primary) hypertension: Secondary | ICD-10-CM | POA: Insufficient documentation

## 2014-09-19 DIAGNOSIS — E119 Type 2 diabetes mellitus without complications: Secondary | ICD-10-CM | POA: Diagnosis not present

## 2014-09-19 DIAGNOSIS — Z79899 Other long term (current) drug therapy: Secondary | ICD-10-CM | POA: Insufficient documentation

## 2014-09-19 DIAGNOSIS — M549 Dorsalgia, unspecified: Secondary | ICD-10-CM

## 2014-09-19 MED ORDER — OXYCODONE HCL 5 MG PO TABS
5.0000 mg | ORAL_TABLET | Freq: Once | ORAL | Status: AC
  Administered 2014-09-19: 5 mg via ORAL
  Filled 2014-09-19: qty 1

## 2014-09-19 MED ORDER — OXYCODONE HCL 5 MG PO TABS: 5.0000 mg | ORAL_TABLET | Freq: Four times a day (QID) | ORAL | Status: AC | PRN

## 2014-09-19 NOTE — ED Provider Notes (Signed)
CSN: [id number]     Arrival date & time 09/19/14  0957 History  This chart was scribed for non-physician practitioner, Ivar Drapeob Layth Cerezo, PA-C, working with Vanetta MuldersScott Zackowski, MD, by Modena JanskyAlbert Thayil, ED Scribe. This patient was seen in room TR05C/TR05C and the patient's care was started at 10:06 AM.    Chief Complaint  Patient presents with  . Back Pain   The history is provided by the patient. No language interpreter was used.   HPI Comments: Kelsey Carey is a 63 y.o. female who presents to the Emergency Department complaining of constant moderate back pain that started 3 weeks ago. She reports that she went to [facility] 5-7 days ago and given tramadol and steroids without any relief. She states that any movement exacerbates the pain, but she is able to ambulate. She reports that nothing relieves the pain. She states that she has a hx of back surgeries.  She denies any bowel or bladder incontinence, or fever.   Past Medical History  Diagnosis Date  . CHF (congestive heart failure)   . Diabetes mellitus   . Hypertension   . Hypercholesterolemia   . Back pain   . Arthritis   . Neuropathy    Past Surgical History  Procedure Laterality Date  . Back surgery    . Tonsillectomy    . Abdominal hysterectomy    . Knee surgery     History reviewed. No pertinent family history. History  Substance Use Topics  . Smoking status: Current Every Day Smoker -- 1.00 packs/day  . Smokeless tobacco: Not on file  . Alcohol Use: Yes     Comment: Pt states that she drinks a beer 2 or 3 times a week   OB History    No data available     Review of Systems  Constitutional: Negative for fever and chills.  Gastrointestinal:       No bowel incontinence  Genitourinary:       No urinary incontinence  Musculoskeletal: Positive for myalgias, back pain and arthralgias.  Neurological:       No saddle anesthesia     Allergies  Acetaminophen; Codeine; Penicillins; and Sulfonamide derivatives  Home  Medications   Prior to Admission medications   Medication Sig Start Date End Date Taking? Authorizing Provider  aspirin 325 MG EC tablet Take 325 mg by mouth daily.    Historical Provider, MD  clopidogrel (PLAVIX) 75 MG tablet Take 75 mg by mouth daily.    Historical Provider, MD  cyclobenzaprine (FLEXERIL) 10 MG tablet Take 10 mg by mouth at bedtime as needed. For muscle spasms    Historical Provider, MD  diclofenac sodium (VOLTAREN) 1 % GEL Apply 1 application topically daily as needed. For pain    Historical Provider, MD  oxyCODONE-acetaminophen (PERCOCET) 5-325 MG per tablet Take 1 tablet by mouth every 12 (twelve) hours as needed. For pain    Historical Provider, MD   BP 163/96 mmHg  Pulse 115  Temp(Src) 97.4 F (36.3 C) (Oral)  Resp 22  SpO2 98% Physical Exam  Constitutional: She is oriented to person, place, and time. She appears well-developed and well-nourished. No distress.  HENT:  Head: Normocephalic and atraumatic.  Eyes: Conjunctivae and EOM are normal. Right eye exhibits no discharge. Left eye exhibits no discharge. No scleral icterus.  Neck: Normal range of motion. Neck supple. No tracheal deviation present.  Cardiovascular: Regular rhythm and normal heart sounds.  Exam reveals no gallop and no friction rub.   No  murmur heard. tachycardic  Pulmonary/Chest: Effort normal and breath sounds normal. No respiratory distress. She has no wheezes.  Abdominal: Soft. She exhibits no distension. There is no tenderness.  Musculoskeletal: Normal range of motion.  Lumbar paraspinal muscles tender to palpation, no bony tenderness, step-offs, or gross abnormality or deformity of spine, patient is able to ambulate, moves all extremities  Bilateral great toe extension intact Bilateral plantar/dorsiflexion intact  Neurological: She is alert and oriented to person, place, and time. She has normal reflexes.  Sensation and strength intact bilaterally Symmetrical reflexes  Skin: Skin is  warm. She is not diaphoretic.  Psychiatric: She has a normal mood and affect. Her behavior is normal. Judgment and thought content normal.  Nursing note and vitals reviewed.   ED Course  Procedures (including critical care time) DIAGNOSTIC STUDIES: Oxygen Saturation is 98% on RA, normal by my interpretation.    COORDINATION OF CARE: 10:10 AM- Pt advised of plan for treatment which includes medication and radiology and pt agrees.  Labs Review Labs Reviewed - No data to display  Imaging Review Dg Lumbar Spine Complete  09/19/2014   CLINICAL DATA:  Lower back pain.  EXAM: LUMBAR SPINE - COMPLETE 4+ VIEW  COMPARISON:  CT scan of Mar 08, 2012.  FINDINGS: Status post surgical posterior fusion of L4 and L5 with bilateral intrapedicular screw placement and interbody fusion. No acute fracture or spondylolisthesis is noted. Remaining disc spaces appear intact. Minimal anterior osteophyte formation is noted at L1-2, L2-3 and L3-4.  IMPRESSION: Status post posterior fusion of L4-5. No acute abnormality seen in the lumbar spine.   Electronically Signed   By: Roque LiasJames  Green M.D.   On: 09/19/2014 11:21     EKG Interpretation None      MDM   Final diagnoses:  Back pain   Patient with acute on chronic back pain.  No new injuries or falls.  Afebrile.  Patient is mildly tachycardic, but is quite upset on exam.  Will treat pain, check plain films given prior surgeries, and will reassess.    Plain films are negative. Patient is ambulatory. No extremity weakness. No bowel or bladder incontinence.  No sign of abscess.  Patient with back pain.  No neurological deficits and normal neuro exam.  Patient is ambulatory.  No loss of bowel or bladder control.  Doubt cauda equina.  Denies fever,  doubt epidural abscess or other lesion. Recommend back exercises, stretching, RICE, and will treat with a short course of oxycodone.  Strongly advised follow-up with the patient's back surgeon.  Encouraged the patient  that there could be a need for additional workup and/or imaging such as MRI, if the symptoms do not resolve. Patient advised that if the back pain does not resolve, or radiates, this could progress to more serious conditions and is encouraged to follow-up with PCP or orthopedics within 2 weeks.    Filed Vitals:   09/19/14 1154  BP: 122/84  Pulse: 99  Temp:   Resp: 18    I personally performed the services described in this documentation, which was scribed in my presence. The recorded information has been reviewed and is accurate.     Roxy Horsemanobert Shontel Santee, PA-C 09/19/14 1157  Vanetta MuldersScott Zackowski, MD 09/23/14 [phone]

## 2014-09-19 NOTE — Discharge Instructions (Signed)
Back Pain, Adult Low back pain is very common. About 1 in 5 people have back pain.The cause of low back pain is rarely dangerous. The pain often gets better over time.About half of people with a sudden onset of back pain feel better in just 2 weeks. About 8 in 10 people feel better by 6 weeks.  CAUSES Some common causes of back pain include:  Strain of the muscles or ligaments supporting the spine.  Wear and tear (degeneration) of the spinal discs.  Arthritis.  Direct injury to the back. DIAGNOSIS Most of the time, the direct cause of low back pain is not known.However, back pain can be treated effectively even when the exact cause of the pain is unknown.Answering your caregiver's questions about your overall health and symptoms is one of the most accurate ways to make sure the cause of your pain is not dangerous. If your caregiver needs more information, he or she may order lab work or imaging tests (X-rays or MRIs).However, even if imaging tests show changes in your back, this usually does not require surgery. HOME CARE INSTRUCTIONS For many people, back pain returns.Since low back pain is rarely dangerous, it is often a condition that people can learn to manageon their own.   Remain active. It is stressful on the back to sit or stand in one place. Do not sit, drive, or stand in one place for more than 30 minutes at a time. Take short walks on level surfaces as soon as pain allows.Try to increase the length of time you walk each day.  Do not stay in bed.Resting more than 1 or 2 days can delay your recovery.  Do not avoid exercise or work.Your body is made to move.It is not dangerous to be active, even though your back may hurt.Your back will likely heal faster if you return to being active before your pain is gone.  Pay attention to your body when you bend and lift. Many people have less discomfortwhen lifting if they bend their knees, keep the load close to their bodies,and  avoid twisting. Often, the most comfortable positions are those that put less stress on your recovering back.  Find a comfortable position to sleep. Use a firm mattress and lie on your side with your knees slightly bent. If you lie on your back, put a pillow under your knees.  Only take over-the-counter or prescription medicines as directed by your caregiver. Over-the-counter medicines to reduce pain and inflammation are often the most helpful.Your caregiver may prescribe muscle relaxant drugs.These medicines help dull your pain so you can more quickly return to your normal activities and healthy exercise.  Put ice on the injured area.  Put ice in a plastic bag.  Place a towel between your skin and the bag.  Leave the ice on for 15-20 minutes, 03-04 times a day for the first 2 to 3 days. After that, ice and heat may be alternated to reduce pain and spasms.  Ask your caregiver about trying back exercises and gentle massage. This may be of some benefit.  Avoid feeling anxious or stressed.Stress increases muscle tension and can worsen back pain.It is important to recognize when you are anxious or stressed and learn ways to manage it.Exercise is a great option. SEEK MEDICAL CARE IF:  You have pain that is not relieved with rest or medicine.  You have pain that does not improve in 1 week.  You have new symptoms.  You are generally not feeling well. SEEK   IMMEDIATE MEDICAL CARE IF:   You have pain that radiates from your back into your legs.  You develop new bowel or bladder control problems.  You have unusual weakness or numbness in your arms or legs.  You develop nausea or vomiting.  You develop abdominal pain.  You feel faint. Document Released: 09/27/2005 Document Revised: 03/28/2012 Document Reviewed: 01/29/2014 [employer] Patient Information 2015 [employer]. This information is not intended to replace advice given to you by your health care provider. Make sure you  discuss any questions you have with your health care provider.  

## 2014-09-19 NOTE — ED Notes (Signed)
Pt stated that she has chronic back pain and went to [facility] 2 weeks ago and was prescribed tramadol but it has not helped and then she went back to [facility] last week and was prescribed tramadol again. Pt stated that she has had back pain for years and has had 5 back surgeries

## 2014-09-19 NOTE — ED Notes (Signed)
PT ambulated with baseline gait; VSS; A&Ox3; no signs of distress; respirations even and unlabored; skin warm and dry; no questions upon discharge.  

## 2014-09-23 ENCOUNTER — Encounter (HOSPITAL_COMMUNITY): Payer: Self-pay | Admitting: Emergency Medicine

## 2014-09-23 ENCOUNTER — Emergency Department (HOSPITAL_COMMUNITY)
Admission: EM | Admit: 2014-09-23 | Discharge: 2014-09-23 | Disposition: A | Discharge status: Home / Self Care | Payer: Medicare (Managed Care) | Attending: Emergency Medicine | Admitting: Emergency Medicine

## 2014-09-23 DIAGNOSIS — M545 Low back pain, unspecified: Secondary | ICD-10-CM

## 2014-09-23 DIAGNOSIS — Z8639 Personal history of other endocrine, nutritional and metabolic disease: Secondary | ICD-10-CM | POA: Diagnosis not present

## 2014-09-23 DIAGNOSIS — I10 Essential (primary) hypertension: Secondary | ICD-10-CM | POA: Diagnosis not present

## 2014-09-23 DIAGNOSIS — M1990 Unspecified osteoarthritis, unspecified site: Secondary | ICD-10-CM | POA: Insufficient documentation

## 2014-09-23 DIAGNOSIS — R000 Tachycardia, unspecified: Secondary | ICD-10-CM | POA: Insufficient documentation

## 2014-09-23 DIAGNOSIS — Z7982 Long term (current) use of aspirin: Secondary | ICD-10-CM | POA: Diagnosis not present

## 2014-09-23 DIAGNOSIS — G8929 Other chronic pain: Secondary | ICD-10-CM | POA: Insufficient documentation

## 2014-09-23 DIAGNOSIS — Z8669 Personal history of other diseases of the nervous system and sense organs: Secondary | ICD-10-CM | POA: Insufficient documentation

## 2014-09-23 DIAGNOSIS — I509 Heart failure, unspecified: Secondary | ICD-10-CM | POA: Insufficient documentation

## 2014-09-23 DIAGNOSIS — Z880 Allergy status to penicillin: Secondary | ICD-10-CM | POA: Diagnosis not present

## 2014-09-23 DIAGNOSIS — E119 Type 2 diabetes mellitus without complications: Secondary | ICD-10-CM | POA: Insufficient documentation

## 2014-09-23 DIAGNOSIS — Z720 Tobacco use: Secondary | ICD-10-CM | POA: Diagnosis not present

## 2014-09-23 MED ORDER — DIAZEPAM 5 MG PO TABS
5.0000 mg | ORAL_TABLET | Freq: Once | ORAL | Status: AC
  Administered 2014-09-23: 5 mg via ORAL
  Filled 2014-09-23: qty 1

## 2014-09-23 MED ORDER — OXYCODONE-ACETAMINOPHEN 5-325 MG PO TABS
2.0000 | ORAL_TABLET | Freq: Once | ORAL | Status: AC
  Administered 2014-09-23: 2 via ORAL
  Filled 2014-09-23: qty 2

## 2014-09-23 MED ORDER — OXYCODONE-ACETAMINOPHEN 5-325 MG PO TABS: 1.0000 | ORAL_TABLET | ORAL | Status: AC | PRN

## 2014-09-23 NOTE — ED Notes (Addendum)
Pt c/o mid to lower back pain x's 3 weeks.  St's was seen here recently and given Rx for pain meds and was told to follow up with her neurosurgeon.  Pt st's she can't see her MD until Jan.  And is out of pain meds.  Pt st's she pain meds she was given do not work

## 2014-09-23 NOTE — Discharge Instructions (Signed)
You were evaluated in the ED today for your back pain. There does not appear to be an emergent cause for your back pain at this time. Please take your pain medicine as directed for any pain he may experience. Please follow-up with your primary care for your regularly scheduled appointment in January. Return to ED for worsening symptoms, increased back pain, numbness or weakness, loss of bowel or bladder function

## 2014-09-23 NOTE — ED Notes (Signed)
Patient reports her brother just passed and she has been out trying get things organized.  Patient did not take meds due to this.  Patient last took medication at 0300.  Patient states she cannot get comfortable due to pain

## 2014-09-23 NOTE — ED Notes (Signed)
Patient does have a ride home

## 2014-09-23 NOTE — ED Provider Notes (Signed)
CSN: [id number]     Arrival date & time 09/23/14  1541 History  This chart was scribed for non-physician practitioner, Joycie PeekBenjamin Sumeet Geter, PA-C working with No att. providers found by Gwenyth Oberatherine Macek, ED scribe. This patient was seen in room TR07C/TR07C and the patient's care was started at 5:19 PM   Chief Complaint  Patient presents with  . Back Pain   The history is provided by the patient. No language interpreter was used.   HPI Comments: Ramon DredgeMartha C Weisberg is a 63 y.o. female with a history of chronic back pain, CHF, DM, HTN, hypercholesterolemia, arthritis and neuropathy who presents to the Emergency Department complaining of an acute on chronic episode of left-sided, lower back pain that started 3 weeks ago. Pt has a follow-up neurology appointment with Dr. Franky Machoabbell on January 4th. She was seen in the ED on 12/10 for the same symptoms and was prescribed hydrocodone with no relief. Pt has also tried heat and pillow support without improvement. She has a history of similar episodes but states current pain is worse than normal. She notes that she is undergoing a lot of stress after her brother passed last week. Pt denies a history of cancer, regular steroid treatments and drug abuse. Pt took Tylenol when she was pregnant at 63 y.o., but is not sure if the rash was from the Tylenol. She denies numbness, weakness and fever as associated symptoms. No other pathologic back pain red flags  Past Medical History  Diagnosis Date  . CHF (congestive heart failure)   . Diabetes mellitus   . Hypertension   . Hypercholesterolemia   . Back pain   . Arthritis   . Neuropathy    Past Surgical History  Procedure Laterality Date  . Back surgery    . Tonsillectomy    . Abdominal hysterectomy    . Knee surgery     No family history on file. History  Substance Use Topics  . Smoking status: Current Every Day Smoker -- 1.00 packs/day  . Smokeless tobacco: Not on file  . Alcohol Use: Yes     Comment: Pt  states that she drinks a beer 2 or 3 times a week   OB History    No data available     Review of Systems  Constitutional: Negative for fever.  Musculoskeletal: Positive for back pain.  Neurological: Negative for weakness and numbness.  All other systems reviewed and are negative.     Allergies  Acetaminophen; Codeine; Penicillins; and Sulfonamide derivatives  Home Medications   Prior to Admission medications   Medication Sig Start Date End Date Taking? Authorizing Provider  aspirin 325 MG EC tablet Take 325 mg by mouth daily.   Yes Historical Provider, MD  FUROSEMIDE PO Take 1 tablet by mouth daily as needed (blood pressure).   Yes Historical Provider, MD  METFORMIN HCL PO Take 1 tablet by mouth daily as needed (Depending on sugar level).   Yes Historical Provider, MD  oxyCODONE (ROXICODONE) 5 MG immediate release tablet Take 1 tablet (5 mg total) by mouth every 6 (six) hours as needed for severe pain. 09/19/14  Yes Roxy Horsemanobert Browning, PA-C  cyclobenzaprine (FLEXERIL) 10 MG tablet Take 10 mg by mouth at bedtime as needed. For muscle spasms    Historical Provider, MD  diclofenac sodium (VOLTAREN) 1 % GEL Apply 1 application topically daily as needed. For pain    Historical Provider, MD  oxyCODONE-acetaminophen (PERCOCET/ROXICET) 5-325 MG per tablet Take 1 tablet by mouth every 4 (four)  hours as needed for moderate pain or severe pain. 09/23/14   Earle GellBenjamin W Lurena Naeve, PA-C   BP 134/72 mmHg  Pulse 99  Temp(Src) 97.8 F (36.6 C) (Oral)  Resp 20  Ht 5\' 10"  (1.778 m)  Wt 206 lb 11.2 oz (93.759 kg)  BMI 29.66 kg/m2  SpO2 96% Physical Exam  Constitutional: She appears well-developed and well-nourished. No distress.  HENT:  Head: Normocephalic and atraumatic.  Mouth/Throat: Oropharynx is clear and moist. No oropharyngeal exudate.  Eyes: Conjunctivae and EOM are normal.  Neck: Neck supple. No tracheal deviation present.  Cardiovascular: Regular rhythm, normal heart sounds and intact  distal pulses.   Mildly tachycardic on exam with normal heart sounds   Pulmonary/Chest: Effort normal and breath sounds normal. No respiratory distress.  Abdominal: Soft. There is no tenderness.  Musculoskeletal: She exhibits tenderness.  Diffuse tenderness to paralumbar muscles; no midline bony tenderness  Neurological:  No focal nauero deficits; gait is baseline without any new apparent ataxia but she is somewhat antalgic; motor and sensation 5/5 in all 4 extremities.  Skin: Skin is warm and dry.  Psychiatric: She has a normal mood and affect. Her behavior is normal.  Nursing note and vitals reviewed.   ED Course  Procedures (including critical care time) DIAGNOSTIC STUDIES: Oxygen Saturation is 96% on RA, adequate by my interpretation.    COORDINATION OF CARE: 5:28 PM Discussed treatment plan with pt at bedside and pt agreed to plan.  Labs Review Labs Reviewed - No data to display  Imaging Review No results found.   EKG Interpretation None     Meds given in ED:  Medications  diazepam (VALIUM) tablet 5 mg (5 mg Oral Given 09/23/14 1736)  oxyCODONE-acetaminophen (PERCOCET/ROXICET) 5-325 MG per tablet 2 tablet (2 tablets Oral Given 09/23/14 1736)    Discharge Medication List as of 09/23/2014  6:39 PM    START taking these medications   Details  oxyCODONE-acetaminophen (PERCOCET/ROXICET) 5-325 MG per tablet Take 1 tablet by mouth every 4 (four) hours as needed for moderate pain or severe pain., Starting 09/23/2014, Until Discontinued, Print       Filed Vitals:   09/23/14 1547 09/23/14 1841  BP: 158/97 134/72  Pulse: 129 99  Temp: 98.3 F (36.8 C) 97.8 F (36.6 C)  TempSrc: Oral Oral  Resp: 24 20  Height: 5\' 10"  (1.778 m)   Weight: 206 lb 11.2 oz (93.759 kg)   SpO2: 96% 96%    MDM  Vitals stable - WNL--afebrile, tachycardia resolved with administration of analgesia Pt resting comfortably in ED. patient feels much better after analgesia administered in  ED. PE--not concerning for other acute or emergent pathology. No pathologic back pain red flags. Patient ambulates with some antalgia but no apparent ataxia. Likely MSK strain  Will DC with pain medicine and muscle relaxers. Strongly encouraged follow-up with patient's PCP for regularly scheduled appointment on January 4. Discussed f/u with PCP and return precautions, pt very amenable to plan. Patient stable, in good condition and is appropriate for discharge   Final diagnoses:  Bilateral low back pain without sciatica    I personally performed the services described in this documentation, which was scribed in my presence. The recorded information has been reviewed and is accurate.    Earle GellBenjamin W Mapletonartner, PA-C 09/24/14 1430  Purvis SheffieldForrest Harrison, MD 09/24/14 [phone]

## 2016-01-01 IMAGING — CR DG LUMBAR SPINE COMPLETE 4+V
5 series · 5 of 5 positions shown · non-contrast
Comparison: CT scan of March 08, 2012.

CLINICAL DATA: Lower back pain.

EXAM:
LUMBAR SPINE - COMPLETE 4+ VIEW

[l-spine ap]
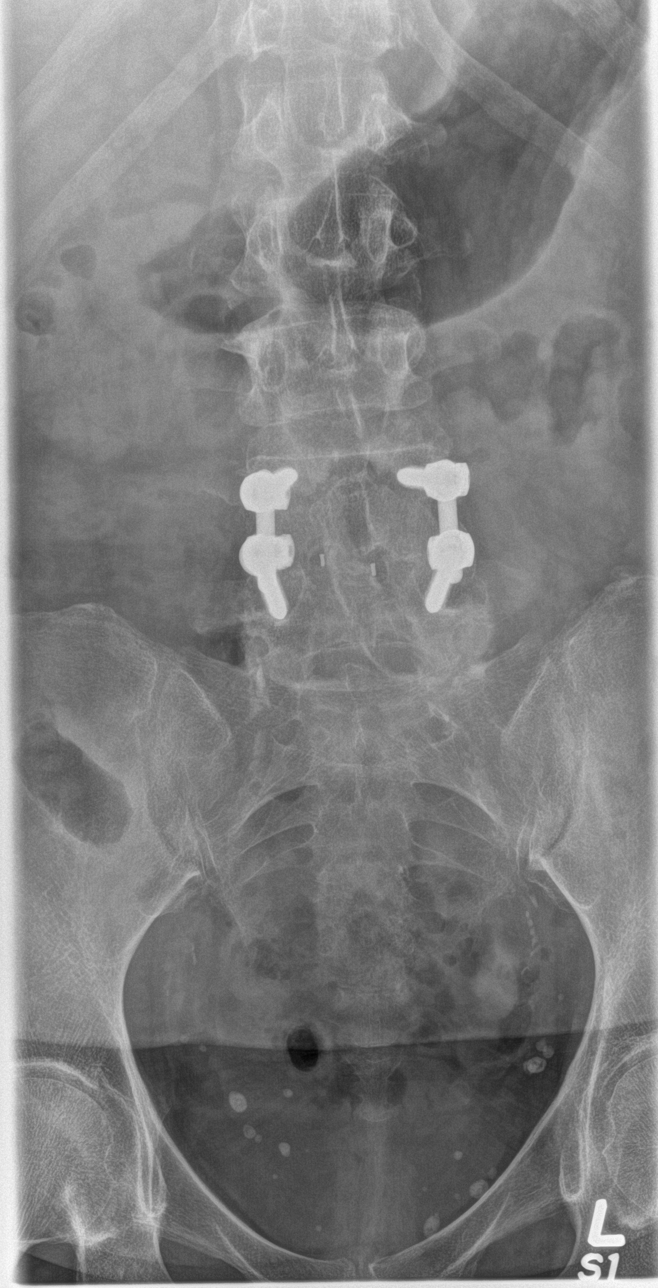

[l-spine obl (1 of 2)]
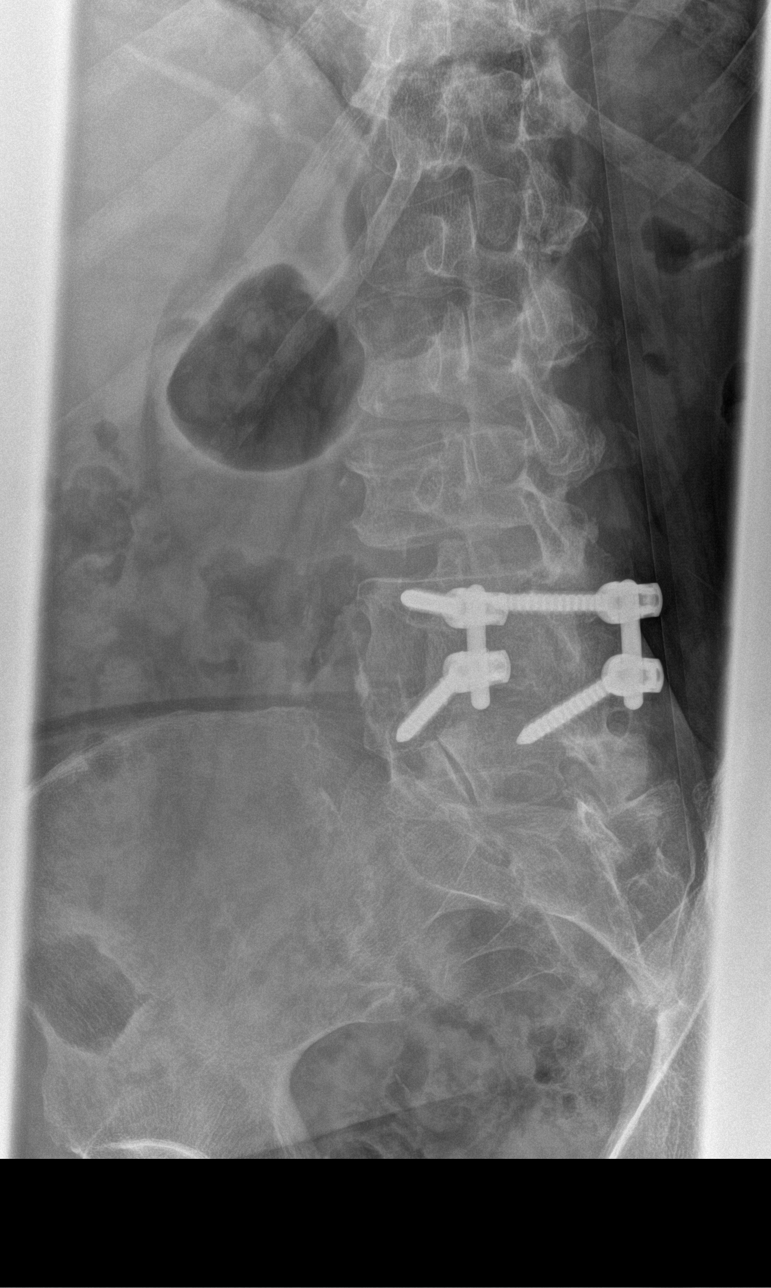

[l-spine obl (2 of 2)]
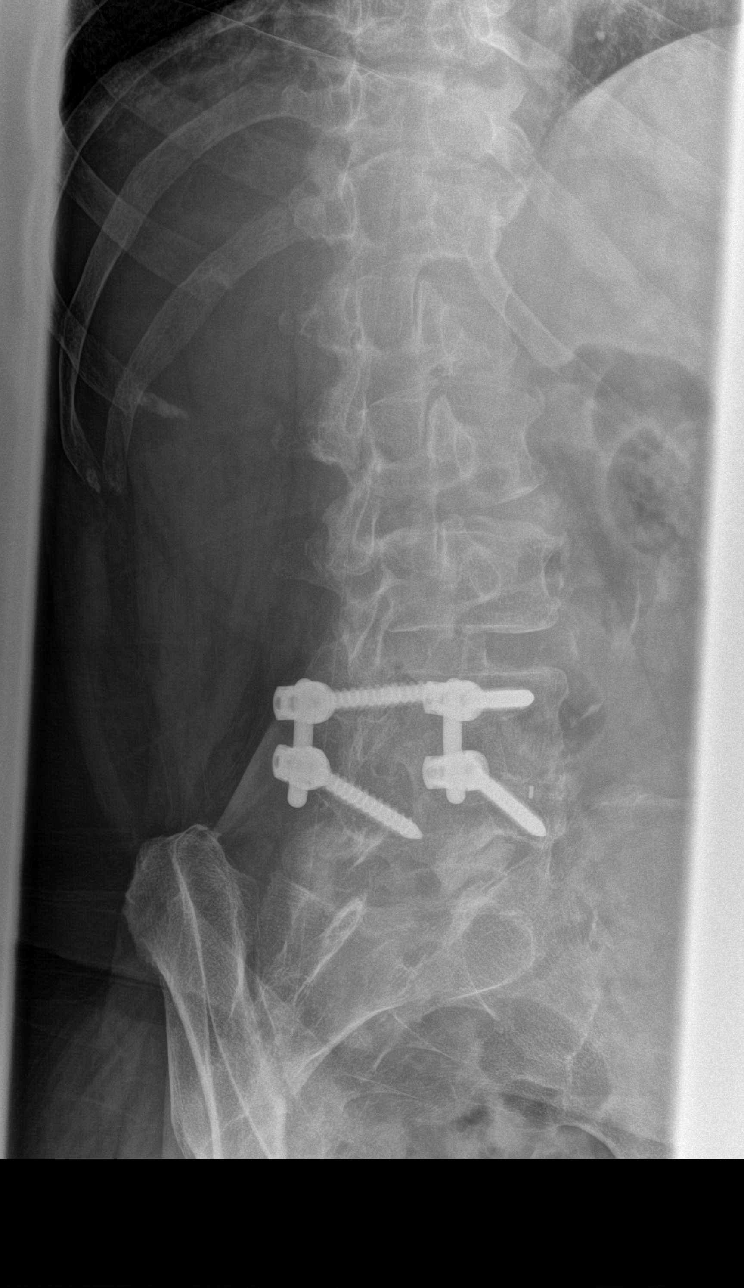

[l-spine lat]
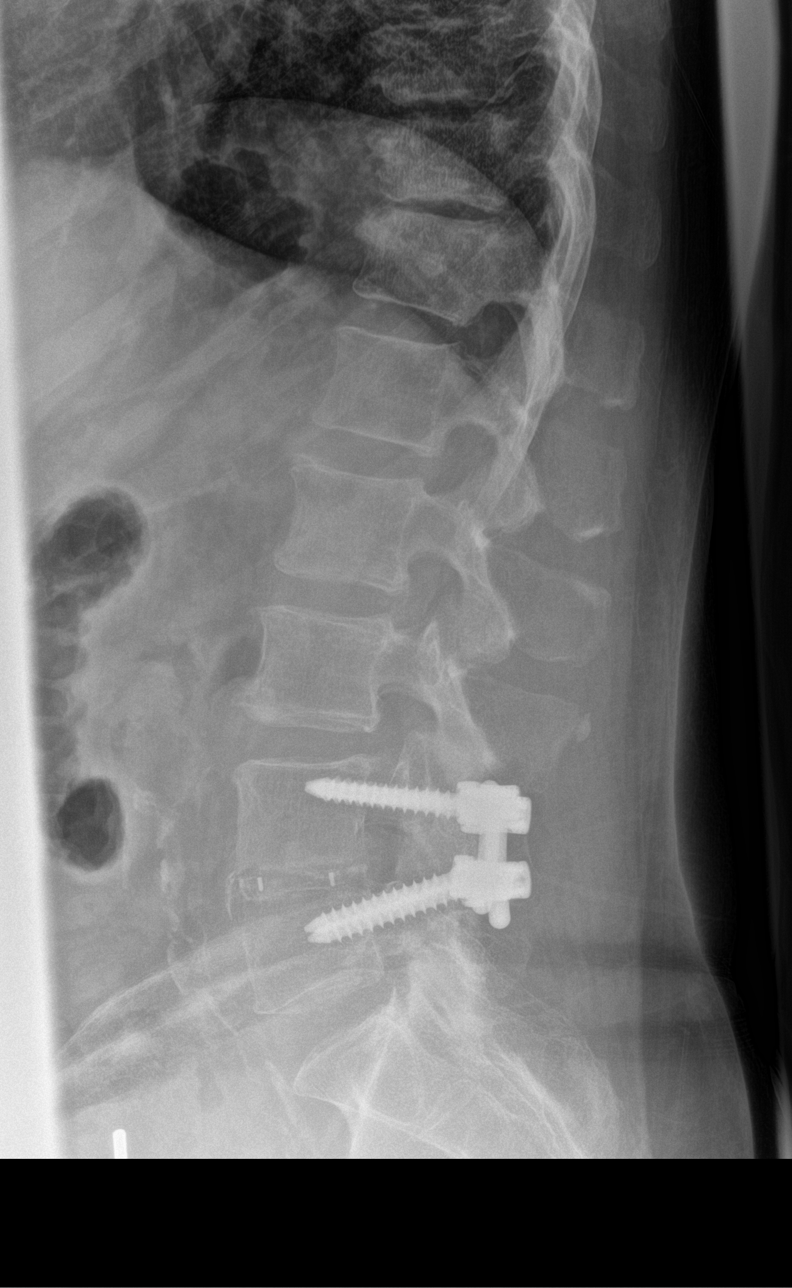

[l-spine spot]
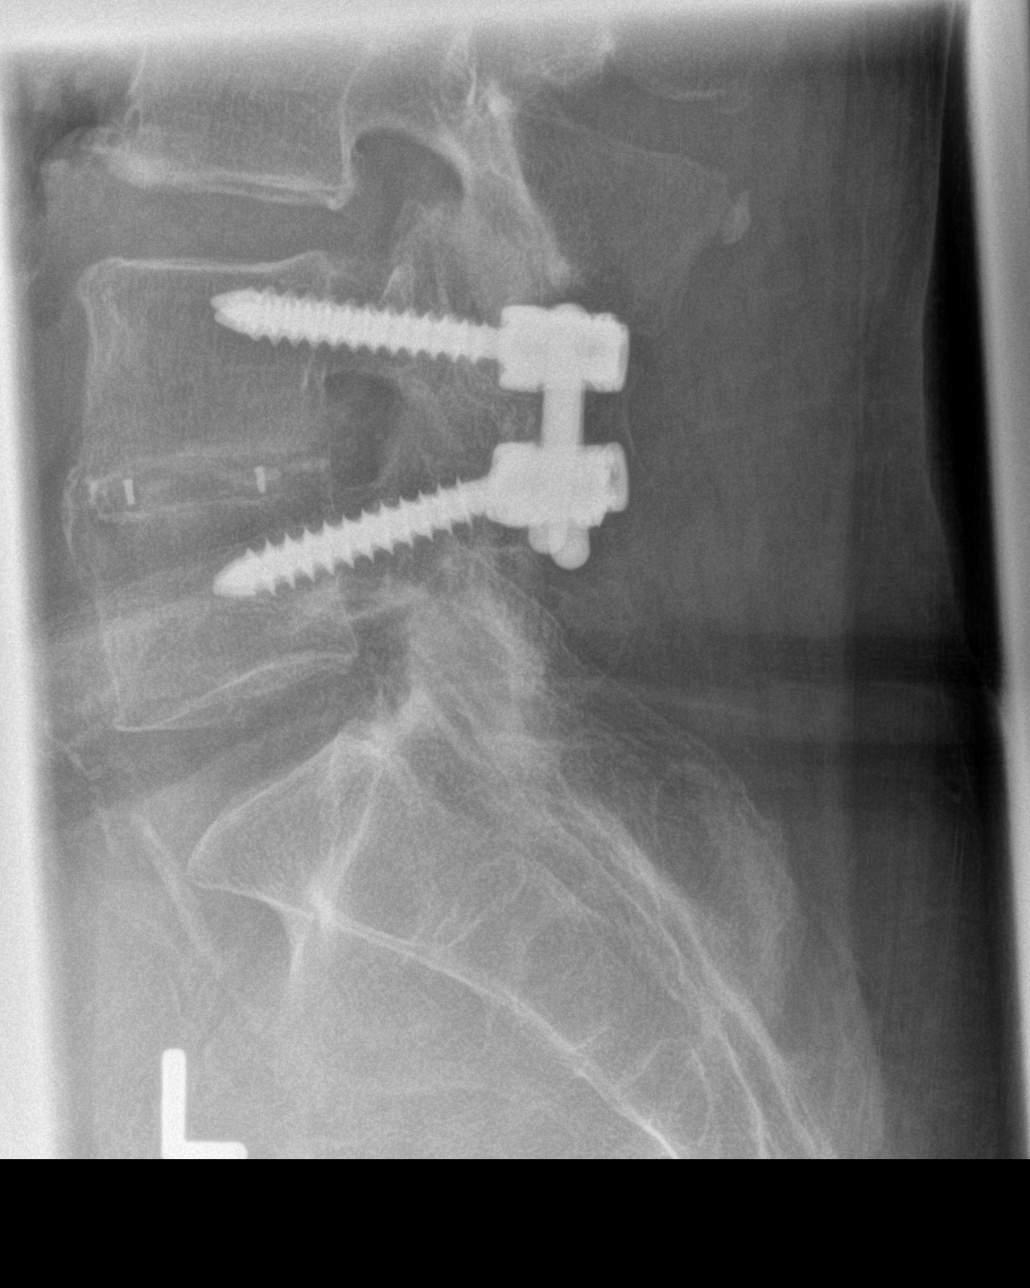

[5 of 5 positions shown; findings below may reference images not displayed]

FINDINGS: Status post surgical posterior fusion of L4 and L5 with bilateral
intrapedicular screw placement and interbody fusion. No acute
fracture or spondylolisthesis is noted. Remaining disc spaces appear
intact. Minimal anterior osteophyte formation is noted at L1-2, L2-3
and L3-4.
IMPRESSION: Status post posterior fusion of L4-5. No acute abnormality seen in
the lumbar spine.

## 2016-09-10 DEATH — deceased
